# Patient Record
Sex: Male | Born: 1970 | Race: White | Hispanic: No | Marital: Married | Smoking: Never smoker
Health system: Southern US, Community
[De-identification: ages and names within clinical notes are randomized; demographics above are authoritative.]

## PROBLEM LIST (undated history)

## (undated) DIAGNOSIS — G609 Hereditary and idiopathic neuropathy, unspecified: Secondary | ICD-10-CM

## (undated) DIAGNOSIS — N189 Chronic kidney disease, unspecified: Secondary | ICD-10-CM

## (undated) DIAGNOSIS — Z5189 Encounter for other specified aftercare: Secondary | ICD-10-CM

## (undated) DIAGNOSIS — K5190 Ulcerative colitis, unspecified, without complications: Secondary | ICD-10-CM

## (undated) DIAGNOSIS — F329 Major depressive disorder, single episode, unspecified: Secondary | ICD-10-CM

## (undated) DIAGNOSIS — F32A Depression, unspecified: Secondary | ICD-10-CM

## (undated) DIAGNOSIS — F319 Bipolar disorder, unspecified: Secondary | ICD-10-CM

## (undated) DIAGNOSIS — I10 Essential (primary) hypertension: Secondary | ICD-10-CM

## (undated) DIAGNOSIS — F419 Anxiety disorder, unspecified: Secondary | ICD-10-CM

## (undated) HISTORY — DX: Ulcerative colitis, unspecified, without complications: K51.90

## (undated) HISTORY — PX: CYST EXCISION: SHX5701

## (undated) HISTORY — DX: Chronic kidney disease, unspecified: N18.9

## (undated) HISTORY — DX: Encounter for other specified aftercare: Z51.89

## (undated) HISTORY — DX: Depression, unspecified: F32.A

## (undated) HISTORY — DX: Essential (primary) hypertension: I10

## (undated) HISTORY — DX: Bipolar disorder, unspecified: F31.9

## (undated) HISTORY — DX: Anxiety disorder, unspecified: F41.9

## (undated) HISTORY — DX: Major depressive disorder, single episode, unspecified: F32.9

## (undated) HISTORY — DX: Hereditary and idiopathic neuropathy, unspecified: G60.9

---
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
## 2007-10-30 ENCOUNTER — Ambulatory Visit: Payer: Self-pay | Admitting: Internal Medicine

## 2007-10-30 LAB — CONVERTED CEMR LAB
Bilirubin Urine: NEGATIVE
Blood in Urine, dipstick: NEGATIVE
Glucose, Urine, Semiquant: NEGATIVE
Ketones, urine, test strip: NEGATIVE
Nitrite: NEGATIVE
Specific Gravity, Urine: 1.015
Urobilinogen, UA: 0.2
WBC Urine, dipstick: NEGATIVE
pH: 7.5

## 2007-10-31 LAB — CONVERTED CEMR LAB
ALT: 21 units/L (ref 0–53)
AST: 20 units/L (ref 0–37)
Albumin: 4.5 g/dL (ref 3.5–5.2)
Alkaline Phosphatase: 72 units/L (ref 39–117)
BUN: 10 mg/dL (ref 6–23)
Basophils Absolute: 0 10*3/uL (ref 0.0–0.1)
Basophils Relative: 0.5 % (ref 0.0–1.0)
Bilirubin, Direct: 0.2 mg/dL (ref 0.0–0.3)
CO2: 29 meq/L (ref 19–32)
Calcium: 9.6 mg/dL (ref 8.4–10.5)
Chloride: 105 meq/L (ref 96–112)
Cholesterol: 177 mg/dL (ref 0–200)
Creatinine, Ser: 1.1 mg/dL (ref 0.4–1.5)
Eosinophils Absolute: 0.3 10*3/uL (ref 0.0–0.6)
Eosinophils Relative: 5 % (ref 0.0–5.0)
GFR calc Af Amer: 96 mL/min
GFR calc non Af Amer: 80 mL/min
Glucose, Bld: 81 mg/dL (ref 70–99)
HCT: 43 % (ref 39.0–52.0)
HDL: 29.4 mg/dL — ABNORMAL LOW (ref >39.0)
Hemoglobin: 15.1 g/dL (ref 13.0–17.0)
LDL Cholesterol: 120 mg/dL — ABNORMAL HIGH (ref 0–99)
Lymphocytes Relative: 20.3 % (ref 12.0–46.0)
MCHC: 35.1 g/dL (ref 30.0–36.0)
MCV: 88.8 fL (ref 78.0–100.0)
Monocytes Absolute: 0.5 10*3/uL (ref 0.2–0.7)
Monocytes Relative: 6.8 % (ref 3.0–11.0)
Neutro Abs: 4.5 10*3/uL (ref 1.4–7.7)
Neutrophils Relative %: 67.4 % (ref 43.0–77.0)
Platelets: 229 10*3/uL (ref 150–400)
Potassium: 4.3 meq/L (ref 3.5–5.1)
RBC: 4.84 M/uL (ref 4.22–5.81)
RDW: 11.9 % (ref 11.5–14.6)
Sodium: 140 meq/L (ref 135–145)
TSH: 2.42 microintl units/mL (ref 0.35–5.50)
Total Bilirubin: 1.1 mg/dL (ref 0.3–1.2)
Total CHOL/HDL Ratio: 6
Total Protein: 6.5 g/dL (ref 6.0–8.3)
Triglycerides: 136 mg/dL (ref 0–149)
VLDL: 27 mg/dL (ref 0–40)
WBC: 6.7 10*3/uL (ref 4.5–10.5)

## 2007-12-07 ENCOUNTER — Ambulatory Visit: Payer: Self-pay | Admitting: Internal Medicine

## 2007-12-20 ENCOUNTER — Encounter: Payer: Self-pay | Admitting: Internal Medicine

## 2007-12-20 ENCOUNTER — Ambulatory Visit (HOSPITAL_BASED_OUTPATIENT_CLINIC_OR_DEPARTMENT_OTHER): Admission: RE | Admit: 2007-12-20 | Discharge: 2007-12-20 | Payer: Self-pay | Admitting: Internal Medicine

## 2007-12-31 ENCOUNTER — Ambulatory Visit: Payer: Self-pay | Admitting: Internal Medicine

## 2008-01-02 ENCOUNTER — Telehealth: Payer: Self-pay | Admitting: Internal Medicine

## 2008-01-08 ENCOUNTER — Ambulatory Visit: Payer: Self-pay | Admitting: Internal Medicine

## 2008-01-08 DIAGNOSIS — F3289 Other specified depressive episodes: Secondary | ICD-10-CM

## 2008-01-09 LAB — CONVERTED CEMR LAB
ALT: 21 units/L (ref 0–53)
AST: 19 units/L (ref 0–37)
Alkaline Phosphatase: 87 units/L (ref 39–117)
BUN: 10 mg/dL (ref 6–23)
Basophils Relative: 0.6 % (ref 0.0–1.0)
CO2: 30 meq/L (ref 19–32)
Calcium: 9.8 mg/dL (ref 8.4–10.5)
Creatinine, Ser: 1.3 mg/dL (ref 0.4–1.5)
Eosinophils Relative: 4.9 % (ref 0.0–5.0)
Glucose, Bld: 83 mg/dL (ref 70–99)
Hemoglobin: 14.7 g/dL (ref 13.0–17.0)
Monocytes Relative: 6.2 % (ref 3.0–11.0)
Platelets: 189 10*3/uL (ref 150–400)
RDW: 11.9 % (ref 11.5–14.6)
Total Bilirubin: 1.1 mg/dL (ref 0.3–1.2)
Total Protein: 6.4 g/dL (ref 6.0–8.3)
WBC: 5.7 10*3/uL (ref 4.5–10.5)

## 2008-01-11 ENCOUNTER — Ambulatory Visit: Payer: Self-pay | Admitting: Pulmonary Disease

## 2008-01-22 ENCOUNTER — Telehealth: Payer: Self-pay | Admitting: Internal Medicine

## 2008-03-10 ENCOUNTER — Ambulatory Visit: Payer: Self-pay | Admitting: Internal Medicine

## 2008-09-03 ENCOUNTER — Telehealth: Payer: Self-pay | Admitting: Internal Medicine

## 2008-09-10 ENCOUNTER — Ambulatory Visit: Payer: Self-pay | Admitting: Internal Medicine

## 2008-09-10 DIAGNOSIS — I10 Essential (primary) hypertension: Secondary | ICD-10-CM | POA: Insufficient documentation

## 2008-10-07 ENCOUNTER — Ambulatory Visit: Payer: Self-pay | Admitting: Gastroenterology

## 2008-10-08 LAB — CONVERTED CEMR LAB
ALT: 23 units/L (ref 0–53)
Basophils Absolute: 0 10*3/uL (ref 0.0–0.1)
Basophils Relative: 0.4 % (ref 0.0–3.0)
CO2: 29 meq/L (ref 19–32)
Creatinine, Ser: 1 mg/dL (ref 0.4–1.5)
Eosinophils Absolute: 0.3 10*3/uL (ref 0.0–0.7)
GFR calc Af Amer: 107 mL/min
HCT: 41.1 % (ref 39.0–52.0)
Hemoglobin: 14.4 g/dL (ref 13.0–17.0)
IgA: 80 mg/dL (ref 68–378)
Lymphocytes Relative: 20.2 % (ref 12.0–46.0)
MCHC: 35.1 g/dL (ref 30.0–36.0)
MCV: 87.7 fL (ref 78.0–100.0)
Monocytes Absolute: 0.4 10*3/uL (ref 0.1–1.0)
Neutro Abs: 3.7 10*3/uL (ref 1.4–7.7)
RBC: 4.68 M/uL (ref 4.22–5.81)
TSH: 2.82 microintl units/mL (ref 0.35–5.50)
Total Bilirubin: 1.1 mg/dL (ref 0.3–1.2)

## 2008-10-15 ENCOUNTER — Telehealth (INDEPENDENT_AMBULATORY_CARE_PROVIDER_SITE_OTHER): Payer: Self-pay | Admitting: *Deleted

## 2008-10-16 ENCOUNTER — Encounter: Payer: Self-pay | Admitting: Gastroenterology

## 2008-10-23 ENCOUNTER — Ambulatory Visit: Payer: Self-pay | Admitting: Internal Medicine

## 2008-11-18 ENCOUNTER — Encounter: Payer: Self-pay | Admitting: Gastroenterology

## 2008-11-19 ENCOUNTER — Ambulatory Visit: Payer: Self-pay | Admitting: Gastroenterology

## 2008-11-25 ENCOUNTER — Ambulatory Visit: Payer: Self-pay | Admitting: Gastroenterology

## 2008-11-25 ENCOUNTER — Encounter: Payer: Self-pay | Admitting: Gastroenterology

## 2008-11-28 ENCOUNTER — Encounter: Payer: Self-pay | Admitting: Gastroenterology

## 2008-12-24 ENCOUNTER — Telehealth: Payer: Self-pay | Admitting: Internal Medicine

## 2009-03-09 ENCOUNTER — Emergency Department (HOSPITAL_COMMUNITY): Admission: EM | Admit: 2009-03-09 | Discharge: 2009-03-09 | Payer: Self-pay | Admitting: Emergency Medicine

## 2009-03-09 ENCOUNTER — Telehealth: Payer: Self-pay | Admitting: Internal Medicine

## 2009-06-23 ENCOUNTER — Telehealth: Payer: Self-pay | Admitting: Internal Medicine

## 2009-10-06 ENCOUNTER — Encounter: Payer: Self-pay | Admitting: Internal Medicine

## 2010-09-15 ENCOUNTER — Ambulatory Visit: Payer: Self-pay | Admitting: Internal Medicine

## 2010-09-17 LAB — CONVERTED CEMR LAB
Albumin: 4.4 g/dL (ref 3.5–5.2)
Alkaline Phosphatase: 87 units/L (ref 39–117)
BUN: 14 mg/dL (ref 6–23)
Creatinine, Ser: 1.2 mg/dL (ref 0.4–1.5)
GFR calc non Af Amer: 71.36 mL/min (ref >60)
Lithium Lvl: 1.11 meq/L (ref 0.80–1.40)
Potassium: 3.7 meq/L (ref 3.5–5.1)
TSH: 3.54 microintl units/mL (ref 0.35–5.50)

## 2010-10-06 ENCOUNTER — Ambulatory Visit: Payer: Self-pay | Admitting: Internal Medicine

## 2010-10-06 DIAGNOSIS — M79609 Pain in unspecified limb: Secondary | ICD-10-CM

## 2010-10-20 ENCOUNTER — Ambulatory Visit: Payer: Self-pay | Admitting: Internal Medicine

## 2010-11-03 ENCOUNTER — Ambulatory Visit: Payer: Self-pay | Admitting: Sports Medicine

## 2010-11-19 DIAGNOSIS — D235 Other benign neoplasm of skin of trunk: Secondary | ICD-10-CM | POA: Insufficient documentation

## 2010-12-07 ENCOUNTER — Telehealth: Payer: Self-pay | Admitting: Internal Medicine

## 2010-12-07 ENCOUNTER — Encounter: Payer: Self-pay | Admitting: Sports Medicine

## 2010-12-07 ENCOUNTER — Ambulatory Visit
Admission: RE | Admit: 2010-12-07 | Discharge: 2010-12-07 | Payer: Self-pay | Source: Home / Self Care | Attending: Sports Medicine | Admitting: Sports Medicine

## 2010-12-07 DIAGNOSIS — M533 Sacrococcygeal disorders, not elsewhere classified: Secondary | ICD-10-CM | POA: Insufficient documentation

## 2010-12-14 NOTE — Assessment & Plan Note (Signed)
Summary: HTN CONCERNS // RS   Vital Signs:  Patient profile:   40 year old male Weight:      217 pounds BMI:     25.17 Temp:     98.0 degrees F oral Pulse rate:   64 / minute Pulse rhythm:   regular BP sitting:   148 / 94  (left arm) Cuff size:   large  Vitals Entered By: Alfred Levins, CMA (September 15, 2010 12:06 PM) CC: bp check   Primary Care Provider:  Birdie Sons, MD  CC:  bp check.  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 Once Daily 2)  Lamictal 200 Mg Tabs (Lamotrigine) .Marland Kitchen.. 1 By Mouth Once Daily 3)  Lithium Carbonate 300 Mg Tabs (Lithium Carbonate) .... 6 Daily At Bedtime  Allergies (verified): 1)  ! 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 Married Never Smoked Alcohol use-yes--hx of overuse in the past Regular exercise-no drinks 2 caffeinated beverages daily  Risk Factors: Alcohol Use: <1 (10/30/2007) Exercise: no (10/30/2007)  Risk Factors: Smoking Status: never (10/30/2007)  Physical Exam  General:  well-developed well-nourished male in no acute distress. HEENT exam atraumatic, normocephalic. Neck supple without lymphadenopathy. Chest clear to auscultation. Cardiac exam S1-S2 are regular. Abdominal exam active bowel sounds, soft. Extremities there is no  clubbing cyanosis or edema.   Impression & Recommendations:  Problem # 1:  HYPERTENSION (ICD-401.9) discussed medications.he needs to start medication. Side effects of lisinopril discussed. He'll start taking lisinopril 10 mg p.o. q. day. BP today: 148/94 Prior BP: 152/86 (10/23/2008)  Labs Reviewed: K+: 3.7 (10/07/2008) Creat: : 1.0 (10/07/2008)   Chol: 177 (10/30/2007)   HDL: 29.4 (10/30/2007)   LDL: 120 (10/30/2007)   TG: 136 (10/30/2007)  Problem # 2:  ATYPICAL DEPRESSIVE DISORDER (UJW-119.14)  dr fuller requests labs---see orders  Orders: Venipuncture (78295) T-Lithium Level (62130-86578) Specimen Handling (46962) TLB-BMP (Basic Metabolic Panel-BMET) (80048-METABOL) TLB-TSH (Thyroid Stimulating Hormone) (84443-TSH) TLB-Hepatic/Liver Function Pnl (80076-HEPATIC)  Complete Medication List: 1)  Risperidone 2 Mg Tabs (Risperidone) .... Once daily 2)  Lamictal 200 Mg Tabs (Lamotrigine) .Marland Kitchen.. 1 by mouth once daily 3)  Lithium Carbonate 300 Mg Tabs (Lithium carbonate) .... 6 daily at bedtime 4)  Lisinopril 10 Mg Tabs (Lisinopril) .... Take 1 tab by mouth daily  Patient Instructions: 1)  see me 4-6 weeks Prescriptions: LISINOPRIL 10 MG  TABS (LISINOPRIL) Take 1 tab by mouth daily  #90 x 3   Entered and Authorized by:   Birdie Sons MD   Signed by:   Birdie Sons MD on 09/15/2010   Method used:   Print then Give to Patient   RxID:   [id number]    Orders Added: 1)  New Patient Level IV [53664] 2)  Venipuncture [40347] 3)  T-Lithium Level [42595-63875] 4)  Specimen Handling [99000] 5)  TLB-BMP (Basic Metabolic Panel-BMET) [80048-METABOL] 6)  TLB-TSH (Thyroid Stimulating Hormone) [84443-TSH] 7)  TLB-Hepatic/Liver Function Pnl [80076-HEPATIC]

## 2010-12-14 NOTE — Assessment & Plan Note (Signed)
Summary: fu on old sport injury/njr   Vital Signs:  Patient profile:   40 year old male Weight:      217 pounds Temp:     98.2 degrees F oral Pulse rate:   66 / minute Pulse rhythm:   regular BP sitting:   132 / 90  (left arm) Cuff size:   large  Vitals Entered By: Alfred Levins, CMA (October 06, 2010 11:08 AM) CC: referral to PT   Primary Care Daleon Willinger:  Birdie Sons, MD  CC:  referral to PT.  History of Present Illness: htn - better at home 130s/70s  pt has a hx of a sports injury---years ago- twisting injury to right hip 20 years ago.  injury persisted for years---unable to run now sxs have recurred just with walking.   ROS: no rashes, no joint effusions.  Current Medications (verified): 1)  Risperidone 2 Mg Tabs (Risperidone) .... Once Daily 2)  Lamictal 200 Mg Tabs (Lamotrigine) .Marland Kitchen.. 1 By Mouth Once Daily 3)  Lithium Carbonate 300 Mg Tabs (Lithium Carbonate) .... 6 Daily At Bedtime 4)  Lisinopril 10 Mg  Tabs (Lisinopril) .... Take 1 Tab By Mouth Daily  Allergies (verified): 1)  ! Penicillin V Potassium (Penicillin V Potassium)  Physical Exam  General:  Tolle, well-developed male in no acute distress. HEENT exam atraumatic, normocephalic, neck supple. Full range of motion of both hips.   Impression & Recommendations:  Problem # 1:  LEG PAIN, RIGHT (ICD-729.5) unclear etiology. I think he needs further evaluation. I don't think chiropractic care will help him. Refer to sports medicine. Orders: Sports Medicine (Sports Med)  Problem # 2:  HYPERTENSION (ICD-401.9)  His updated medication list for this problem includes:    Lisinopril 10 Mg Tabs (Lisinopril) .Marland Kitchen... Take 1 tab by mouth daily  BP today: 132/90 Prior BP: 148/94 (09/15/2010)  Labs Reviewed: K+: 3.7 (09/15/2010) Creat: : 1.2 (09/15/2010)   Chol: 177 (10/30/2007)   HDL: 29.4 (10/30/2007)   LDL: 120 (10/30/2007)   TG: 136 (10/30/2007)  Complete Medication List: 1)  Risperidone 2 Mg Tabs  (Risperidone) .... Once daily 2)  Lamictal 200 Mg Tabs (Lamotrigine) .Marland Kitchen.. 1 by mouth once daily 3)  Lithium Carbonate 300 Mg Tabs (Lithium carbonate) .... 6 daily at bedtime 4)  Lisinopril 10 Mg Tabs (Lisinopril) .... Take 1 tab by mouth daily   Orders Added: 1)  Est. Patient Level III [04540] 2)  Sports Medicine [Sports Med]
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
## 2010-12-16 NOTE — Letter (Signed)
Summary: *Consult Note  [facility]  [address]   [place], [place] [address]   Phone: [phone]  Fax: [phone]    Re:    Sean Ray DOB:    10-19-1971 Birdie Sons, MD [facility] 12/07/10   Dear Smitty Cords:    Thank you for requesting that we see the above patient for consultation.  A copy of the detailed office note will be sent under separate cover, for your review.  Evaluation today is consistent with:  1)  SACROILIAC JOINT DYSFUNCTION (ICD-724.6) 2) Leg pain on Right   Our recommendation is for: series of exercises and stretches to see if we can restore his normal biomechanics.  I think he began compensating for his original injury and has not been able to restore normal pelvic rotation necessary for effective running.   New Orders include:  1)  Consultation Level II [99242]   After today's visit, the patients current medications include: 1)  RISPERIDONE 2 MG TABS (RISPERIDONE) once daily 2)  LAMICTAL 200 MG TABS (LAMOTRIGINE) 1 by mouth once daily 3)  LITHIUM CARBONATE 300 MG TABS (LITHIUM CARBONATE) 6 daily at bedtime 4)  LISINOPRIL 10 MG  TABS (LISINOPRIL) Take 1 tab by mouth daily   Thank you for this consultation.  If you have any further questions regarding the care of this patient, please do not hesitate to contact me @ [phone].  Thank you for this opportunity to look after your patient.  Sincerely,  Vincent Gros MD

## 2010-12-16 NOTE — Assessment & Plan Note (Signed)
Summary: 5 wk rov/njr---PT [facility] // RS ,pt rsc per doc/njr   Vital Signs:  Patient profile:   40 year old male Weight:      216 pounds Temp:     98.4 degrees F oral Pulse rate:   72 / minute Pulse rhythm:   regular BP sitting:   122 / 80  (left arm) Cuff size:   large  Vitals Entered By: Azucena Freed,  MA Student  CC: 5 wk f/u---bp concerns, mole concerns, 2nd toe on L fungal infection?   Primary Care Provider:  Birdie Sons, MD  CC:  5 wk f/u---bp concerns, mole concerns, and 2nd toe on L fungal infection?.  History of Present Illness:  Follow-Up Visit      This is a 40 year old man who presents for Follow-up visit.  The patient denies chest pain and palpitations.  Since the last visit the patient notes no new problems or concerns.  The patient reports taking meds as prescribed and monitoring BP.  When questioned about possible medication side effects, the patient notes none.   home Bps 110s-140/70s-88  tolerating meds  new concerns---has a   All other systems reviewed and were negative   Allergies: 1)  ! 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 HEENT exam atraumatic,  normocephalic, neck supple. Chest clear to auscultation cardiac exam S1-S2 are regular. Extremities without edema.   Impression & Recommendations:  Problem # 1:  HYPERTENSION (ICD-401.9) Assessment Improved controlled continue current medications  His updated medication list for this problem includes:    Lisinopril 10 Mg Tabs (Lisinopril) .Marland Kitchen... 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 Once daily 2)  Lamictal 200 Mg Tabs (Lamotrigine) .Marland Kitchen.. 1 by mouth once daily 3)  Lithium Carbonate 300 Mg Tabs (Lithium carbonate) .... 6 daily at bedtime 4)  Lisinopril 10 Mg Tabs (Lisinopril) .... Take 1 tab by mouth daily   Orders Added: 1)  Est. Patient Level III [[address]]

## 2010-12-16 NOTE — Progress Notes (Signed)
  Phone Note Call from Patient   Caller: Patient Call For: Birdie Sons MD Summary of Call: Pt's pyschiatrist would like to have Dr. Cato Mulligan make a referral to a Urologist for his long term medication use. [phone] Initial call taken by: Lynann Beaver CMA AAMA,  December 07, 2010 3:35 PM  Follow-up for Phone Call        urologist? why? what long term medication? Follow-up by: Birdie Sons MD,  December 08, 2010 6:59 AM  Additional Follow-up for Phone Call Additional follow up Details #1::        He says Lithium and his other Bipolar meds are  what the pysch is telling him that interfere with kidney function? Additional Follow-up by: Lynann Beaver CMA AAMA,  December 08, 2010 8:17 AM    Additional Follow-up for Phone Call Additional follow up Details #2::     scheduled basic metabolic panel every 6 months. last one was in November.  renal function has been normal every time it has been checked. Follow-up by: Birdie Sons MD,  December 09, 2010 5:28 AM  Additional Follow-up for Phone Call Additional follow up Details #3:: Details for Additional Follow-up Action Taken: He will have his Pysch call us with exactly what they want. Additional Follow-up by: Lynann Beaver CMA AAMA,  December 09, 2010 8:25 AM

## 2011-02-01 ENCOUNTER — Ambulatory Visit (INDEPENDENT_AMBULATORY_CARE_PROVIDER_SITE_OTHER): Payer: BC Managed Care – PPO | Admitting: Sports Medicine

## 2011-02-01 ENCOUNTER — Encounter: Payer: Self-pay | Admitting: Sports Medicine

## 2011-02-01 VITALS — BP 104/70 | Ht 78.0 in | Wt 220.0 lb

## 2011-02-01 DIAGNOSIS — M542 Cervicalgia: Secondary | ICD-10-CM

## 2011-02-01 DIAGNOSIS — M533 Sacrococcygeal disorders, not elsewhere classified: Secondary | ICD-10-CM

## 2011-02-01 DIAGNOSIS — F319 Bipolar disorder, unspecified: Secondary | ICD-10-CM

## 2011-02-01 NOTE — Progress Notes (Signed)
Subjective:    Patient ID: Sean Ray is a 40 y.o. male.  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 with running in lower back       - pefromed exercises  For 8 weeks approx 3-4x a week      -- Denies any back pain or hip pain for the past few weeks            Still feels weaker on the right side at times but feels he is getting stronger  Has not noticed any change with the temp orthotics  Regarding his gait but they feel comoftable  Neck Pain- woke up a few days ago with tingling and numbness and slight neck pain, felt like he slept on his neck wrong      ROS       Objective:   Ortho Exam    GEN- NAD, alert and oriented   Neck- normal ROM, no cervical spine  tenderness    MSK- Hip- FROM, IR/ER > 75 degrees,                      Strength- Abductors 5/5 bilat, flexors- 5/5 bilat             Faber- tight o nleft side             SI mobility- more mobile onright compared to left             UE- FROM  Neuro- sensation grossly in tact   Gait- no pronation, foot hits midline with run, SI joint dysfunction not noticed  Assessment:        SI joint dysfunction- improvement in movement of SI with initial exercise program, his running technique is corrected with strengthening and cushion for mild pronation  Neck pain- good mobility of neck, likely stretch injury secondary to pt hypermobility state  Bipolar disorder I think an exercise program with focus on 30 mins aerobic daily will be beneficial i limiting depression sxs    Plan:  . Will plan to start walk/run program, continue maintenance exercises for Hip mobility.       Neck- watch sleeping position, given exercises to stretch trapezius    Note- also discussed that a good exercise program may also contribute to improving his depressive mood/Bipolar disorder Milinda Antis MD, PGY-3

## 2011-02-01 NOTE — Progress Notes (Signed)
Pt here today to f/u with his SI joint and L leg pain which he says is about the same as last visit which he attributes to not having run.

## 2011-02-01 NOTE — Patient Instructions (Signed)
Start your walking/Run program- start with 20 minutes and increase by 5 minutes Uses stretches in between for any discomofort Continue your rehab exercises as previous( adductors/abductors and hip rotation, start step exercises) Return in 3 months

## 2011-02-23 LAB — COMPREHENSIVE METABOLIC PANEL
ALT: 21 U/L (ref 0–53)
AST: 22 U/L (ref 0–37)
Calcium: 9.5 mg/dL (ref 8.4–10.5)
GFR calc Af Amer: >60 mL/min (ref >60)
Sodium: 142 mEq/L (ref 135–145)
Total Protein: 6.5 g/dL (ref 6.0–8.3)

## 2011-02-23 LAB — ETHANOL: Alcohol, Ethyl (B): <5 mg/dL (ref 0–10)

## 2011-03-29 NOTE — Assessment & Plan Note (Signed)
[facility]                                 ON-CALL NOTE   Sean Ray, Sean Ray                       MRN:          [id number]  DATE:12/30/2007                            DOB:          03-06-1971    PHONE NUMBER:  [phone]   Patient Dr. [name]   The patient fell today playing tennis.  Hit his head.  Had no loss of  consciousness et Karie Soda.  Since he is otherwise well, advised Tylenol,  ice, and call to see Dr. [name] tomorrow for eval.     Eugenio Hoes. Tawanna Cooler, MD  Electronically Signed    JAT/MedQ  DD: 12/30/2007  DT: 12/31/2007  Job #: [id number]

## 2011-03-29 NOTE — Procedures (Signed)
Sean Ray, Sean Ray              ACCOUNT NO.:  [id number]   MEDICAL RECORD NO.:  [id number]          PATIENT TYPE:  OUT   LOCATION:  SLEEP CENTER                 FACILITY:  [facility]   PHYSICIAN:  Barbaraann Share, MD,FCCPDATE OF BIRTH:  01/10/69   DATE OF STUDY:  12/20/2007                            NOCTURNAL POLYSOMNOGRAM   REFERRING PHYSICIAN:  Bruce H. Swords, MD   INDICATION FOR STUDY:  Hypersomnia with sleep apnea.   EPWORTH SLEEPINESS SCORE:  Is 4.   MEDICATIONS:   SLEEP ARCHITECTURE:  The patient had a total sleep time of 369 minutes  with no slow wave sleep and decreased REM.  Sleep onset latency was  normal at 25 minutes and REM onset was prolonged at 127 minutes.  Sleep  efficiency was decreased at 88%.   RESPIRATORY DATA:  The patient was found to have 31 obstructive apnea,  four central apneas and 12 obstructive hypopneas, for an apnea/hypopnea  index of 8 events per hour.  He was also noted to have 17 respiratory  effort-related arousals for a respiratory disturbance index of 10 events  per hour.  Loud snoring was noted throughout.  It should also be noted  that all of the patient's events occurred in the supine position.   OXYGEN DATA:  There was an O2 desaturation as low as 92% with the  patient's obstructive events.   CARDIAC DATA:  No clinically-significant arrhythmias were noted.   MOVEMENT/PARASOMNIA:  None.   IMPRESSIONS/RECOMMENDATIONS:  1. Mild obstructive sleep apnea with an apnea/hypopnea index of eight      events per hour and a respiratory disturbance index of 10 events      per hour when respiratory effort-related arousals are included.      All of the patient's events occurred in the supine position and      there was O2 desaturation as low as 92%.  Treatment for this degree      of sleep apnea can include weight loss alone if applicable,      positional therapy since all of his events occurred in the supine      position, oral appliance, upper  airway surgery and finally CPAP.      Clinical correlation is      suggested.  2. No clinically-significant arrhythmias or abnormal behaviors were      noted.      Barbaraann Share, MD,FCCP  Diplomate, American Board of Sleep  Medicine  Electronically Signed     [facility]/MEDQ  D:  01/09/2008 06:36:29  T:  01/09/2008 14:33:27  Job:  [id number]

## 2011-05-10 ENCOUNTER — Ambulatory Visit (INDEPENDENT_AMBULATORY_CARE_PROVIDER_SITE_OTHER): Payer: BC Managed Care – PPO | Admitting: Family Medicine

## 2011-05-10 ENCOUNTER — Encounter: Payer: Self-pay | Admitting: Family Medicine

## 2011-05-10 VITALS — BP 131/78

## 2011-05-10 DIAGNOSIS — G5700 Lesion of sciatic nerve, unspecified lower limb: Secondary | ICD-10-CM | POA: Insufficient documentation

## 2011-05-10 NOTE — Patient Instructions (Signed)
PIRIFORMIS SYNDROME REHAB 1. Work on pretzel stretching, shoulder back and leg draped in front. 3-5 sets, 30 sec.. 2. hip abductor rotations. standing, hip flexion and rotation outward then inward. 3 sets, 15 reps. when can do comfortably, add ankle weights starting at 2 pounds.  3. cross over stretching - shoulder back to ground, same side leg crossover. 3-5 sets for 30 min..  4. SINK STRETCH - YOU CAN DO THIS WHENEVER YOU WANT DURING THE DAY  Tennis ball underneath area in buttocks - on a hard surface underneath Can also massage this area with an electronic massager or hand  F/u if not improving in 4-6 weeks

## 2011-05-10 NOTE — Progress Notes (Signed)
  Subjective:    Patient ID: Sean Ray, male    DOB: June 08, 1971, 40 y.o.   MRN: [id number]  HPI 40 yo M with history of lumbar back pain who presents with 10 day history of pain in Right gluteus and hamstring:  1.  Right Buttock pain:  Recently returned from 12 hour plane flight to [place], returned about 2 weeks ago, where he also participated in several vigorous hikes up and down hills, usually not used to much physical exertion.  After he returned he began having pain in Right buttock area with some radiation down hamstring, but mostly in buttock.  Also c/o some paresthesias in calf and hamstring along with numbness on sole of foot.  Pain described as sharp, shooting pain, rates 8/10 at worst, exacerbated by standing and movement in bed.  Has not been taking anything for relief, no ice or heat.  Denies any fevers, weight loss, bowel/urinary incontinence, weakness, headaches.      Review of Systems REVIEW OF SYSTEMS  GEN: No fevers, chills. Nontoxic. Primarily MSK c/o today. MSK: Detailed in the HPI GI: tolerating PO intake without difficulty Neuro: detailed above Otherwise the pertinent positives of the ROS are noted above.       Objective:   Physical Exam Gen:  Alert, cooperative patient who appears stated age in no acute distress.  Vital signs reviewed. MSK:  Tender to palpation Right piriformis area.  Reverse Faber positive for pain.  Straight leg raise reproduces pain - buttocks only, does not cause any numbness nor paresthesias.  Good ROM BL hips and knees.  Strength 5/5 BL LE's.   CV:  Distal pulses +2 bilaterally Neuro:  Sensation grossly intact and equal bilaterally lower extremities.  No numbness noted on exam.  No gait abnormality.   Heel walk and toe walk WNL Neurovascularly fully intact       Assessment & Plan:

## 2011-05-10 NOTE — Assessment & Plan Note (Addendum)
Using an anatomical chart, reviewed with the patient the structures involved and how they related to diagnosis. The patient indicated understanding.   Also given a handout with more extensive Piriformis stretching, hip flexor and abductor strengthening, ham stretching  Rec deep massage, explained self-massage with ball

## 2011-05-20 ENCOUNTER — Ambulatory Visit: Payer: Self-pay | Admitting: Internal Medicine

## 2011-06-01 ENCOUNTER — Ambulatory Visit: Payer: BC Managed Care – PPO | Admitting: Sports Medicine

## 2011-06-22 ENCOUNTER — Ambulatory Visit (INDEPENDENT_AMBULATORY_CARE_PROVIDER_SITE_OTHER): Payer: BC Managed Care – PPO | Admitting: Internal Medicine

## 2011-06-22 ENCOUNTER — Encounter: Payer: Self-pay | Admitting: Internal Medicine

## 2011-06-22 VITALS — BP 138/88 | HR 76 | Temp 97.9°F | Ht 78.0 in | Wt 212.0 lb

## 2011-06-22 DIAGNOSIS — I10 Essential (primary) hypertension: Secondary | ICD-10-CM

## 2011-06-22 DIAGNOSIS — F3289 Other specified depressive episodes: Secondary | ICD-10-CM

## 2011-06-22 NOTE — Progress Notes (Signed)
  Subjective:    Patient ID: Sean Ray, male    DOB: 1971/08/12, 40 y.o.   MRN: [id number]  HPI  Psych---he is seeing psychiatrist---thinking about stopping lithium  htn---tolerating meds  Past Medical History  Diagnosis Date  . Bipolar disorder    No past surgical history on file.  reports that he has never smoked. He does not have any smokeless tobacco history on file. He reports that he drinks alcohol. His drug history not on file. family history includes Cancer in his mother and Hypertension in his mother. Allergies  Allergen Reactions  . Penicillins     REACTION: as child, swelling     Review of Systems  patient denies chest pain, shortness of breath, orthopnea. Denies lower extremity edema, abdominal pain, change in appetite, change in bowel movements. Patient denies rashes, musculoskeletal complaints. No other specific complaints in a complete review of systems.      Objective:   Physical Exam  well-developed well-nourished male in no acute distress. HEENT exam atraumatic, normocephalic, neck supple without jugular venous distention. Chest clear to auscultation cardiac exam S1-S2 are regular. Abdominal exam overweight with bowel sounds, soft and nontender. Extremities no edema. Neurologic exam is alert with a normal gait.        Assessment & Plan:

## 2011-06-22 NOTE — Assessment & Plan Note (Signed)
Seeing psychiatry He has an intermittent tremor Considering changing to a different medication

## 2011-06-30 NOTE — Assessment & Plan Note (Signed)
BP Readings from Last 3 Encounters:  06/22/11 138/88  05/10/11 131/78  02/01/11 104/70   Fair control. Continue current medications.

## 2011-08-22 ENCOUNTER — Other Ambulatory Visit: Payer: Self-pay | Admitting: *Deleted

## 2011-08-22 MED ORDER — LISINOPRIL 10 MG PO TABS: 10.0000 mg | ORAL_TABLET | Freq: Every day | ORAL | Status: AC

## 2011-11-16 ENCOUNTER — Ambulatory Visit (INDEPENDENT_AMBULATORY_CARE_PROVIDER_SITE_OTHER): Payer: BC Managed Care – PPO | Admitting: Family

## 2011-11-16 ENCOUNTER — Encounter: Payer: Self-pay | Admitting: Family

## 2011-11-16 VITALS — BP 140/90 | HR 69 | Temp 98.3°F | Wt 197.0 lb

## 2011-11-16 DIAGNOSIS — H6690 Otitis media, unspecified, unspecified ear: Secondary | ICD-10-CM

## 2011-11-16 MED ORDER — AZITHROMYCIN 250 MG PO TABS: ORAL_TABLET | ORAL | Status: AC

## 2011-11-16 NOTE — Patient Instructions (Signed)

## 2011-11-16 NOTE — Progress Notes (Signed)
  Subjective:    Patient ID: Sean Ray, male    DOB: October 06, 1971, 41 y.o.   MRN: [id number]  HPI 41 year old white male, nonsmoker, patient of Dr. Hermelinda Medicus is in today with complaints of left ear pain that's been going on for one week he feels a sense of fullness in both years. Reports a cough and some cough for several weeks that's productive of yellow sputum. He has had 2 doses of Sudafed did not help. He denies any lightheadedness, dizziness, sinus pressure, chest pain, shortness of breath, nausea or vomiting.   Review of Systems  HENT: Positive for ear pain.   Respiratory: Positive for cough.   Cardiovascular: Negative.   Gastrointestinal: Negative.   Genitourinary: Negative.   Musculoskeletal: Negative.   Skin: Negative.   Neurological: Negative.   Psychiatric/Behavioral: Negative.    Past Medical History  Diagnosis Date  . Bipolar disorder     History   Social History  . Marital Status: Married    Spouse Name: N/A    Number of Children: N/A  . Years of Education: N/A   Occupational History  . Not on file.   Social History Main Topics  . Smoking status: Never Smoker   . Smokeless tobacco: Not on file  . Alcohol Use: Yes     hx of alcohol abuse  . Drug Use: Not on file  . Sexually Active: Not on file   Other Topics Concern  . 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  Family History  Problem Relation Age of Onset  . Cancer Mother     breast  . Hypertension Mother     Allergies  Allergen Reactions  . Penicillins     REACTION: as child, swelling    Current Outpatient Prescriptions on File Prior to Visit  Medication Sig Dispense Refill  . lamoTRIgine (LAMICTAL) 200 MG tablet Take 200 mg by mouth daily.        Marland Kitchen lisinopril (PRINIVIL,ZESTRIL) 10 MG tablet Take 1 tablet (10 mg total) by mouth daily.  90 tablet  1  . lithium 300 MG  capsule Take 300 mg by mouth. 5 tabs QHS      . risperiDONE (RISPERDAL) 1 MG tablet Take 1 mg by mouth daily.          BP 140/90  Pulse 69  Temp(Src) 98.3 F (36.8 C) (Oral)  Wt 197 lb (89.359 kg)chart    Objective:   Physical Exam  Constitutional: He is oriented to person, place, and time. He appears well-developed and well-nourished.  HENT:  Right Ear: External ear normal.  Nose: Nose normal.  Mouth/Throat: Oropharynx is clear and moist.       Left tympanic membrane red and bulging. Right ear normal  Eyes: Conjunctivae are normal.  Neck: Normal range of motion. Neck supple.  Cardiovascular: Normal rate, regular rhythm and normal heart sounds.   Pulmonary/Chest: Effort normal and breath sounds normal.  Musculoskeletal: Normal range of motion.  Neurological: He is alert and oriented to person, place, and time.  Skin: Skin is warm and dry.  Psychiatric: He has a normal mood and affect.          Assessment & Plan:  Assessment: Left otitis media  Plan continue Sudafed as directed. Z-Pak as directed. Patient to call the office if symptoms worsen or persist. Recheck scheduled appearing.

## 2011-11-22 DIAGNOSIS — F319 Bipolar disorder, unspecified: Secondary | ICD-10-CM | POA: Insufficient documentation

## 2011-12-23 ENCOUNTER — Ambulatory Visit (INDEPENDENT_AMBULATORY_CARE_PROVIDER_SITE_OTHER): Payer: BC Managed Care – PPO | Admitting: Internal Medicine

## 2011-12-23 ENCOUNTER — Ambulatory Visit: Payer: BC Managed Care – PPO | Admitting: Internal Medicine

## 2011-12-23 ENCOUNTER — Encounter: Payer: Self-pay | Admitting: Internal Medicine

## 2011-12-23 DIAGNOSIS — I10 Essential (primary) hypertension: Secondary | ICD-10-CM

## 2011-12-23 NOTE — Progress Notes (Signed)
Patient ID: Sean Ray, male   DOB: 12/04/1970, 41 y.o.   MRN: [id number] Left ear---has had issues with "gurgling" after a plane ride---now improving.   HTN---tolerating meds without difficulty  Atypical depression---sees psych regularly.  Past Medical History  Diagnosis Date  . Bipolar disorder     History   Social History  . Marital Status: Married    Spouse Name: N/A    Number of Children: N/A  . Years of Education: N/A   Occupational History  . Not on file.   Social History Main Topics  . Smoking status: Never Smoker   . Smokeless tobacco: Not on file  . Alcohol Use: Yes     hx of alcohol abuse  . Drug Use: Not on file  . Sexually Active: Not on file   Other Topics Concern  . 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  Family History  Problem Relation Age of Onset  . Cancer Mother     breast  . Hypertension Mother     Allergies  Allergen Reactions  . Penicillins     REACTION: as child, swelling    Current Outpatient Prescriptions on File Prior to Visit  Medication Sig Dispense Refill  . lamoTRIgine (LAMICTAL) 200 MG tablet Take 200 mg by mouth daily.        Marland Kitchen lisinopril (PRINIVIL,ZESTRIL) 10 MG tablet Take 1 tablet (10 mg total) by mouth daily.  90 tablet  1  . lithium 300 MG capsule Take 300 mg by mouth. 5 tabs QHS         patient denies chest pain, shortness of breath, orthopnea. Denies lower extremity edema, abdominal pain, change in appetite, change in bowel movements. Patient denies rashes, musculoskeletal complaints. No other specific complaints in a complete review of systems.   BP 122/84  Pulse 64  Temp(Src) 98.1 F (36.7 C) (Oral)  Wt 195 lb (88.451 kg)  well-developed well-nourished male in no acute distress. HEENT exam atraumatic, normocephalic, neck supple without jugular venous distention. Chest clear to  auscultation cardiac exam S1-S2 are regular. Abdominal exam overweight with bowel sounds, soft and nontender. Extremities no edema. Neurologic exam is alert with a normal gait.   Ear pain: resolving

## 2011-12-23 NOTE — Assessment & Plan Note (Signed)
Controlled Continue current meds Psychiatry has checked recent

## 2012-01-18 DIAGNOSIS — R0789 Other chest pain: Secondary | ICD-10-CM | POA: Insufficient documentation

## 2012-01-18 DIAGNOSIS — F419 Anxiety disorder, unspecified: Secondary | ICD-10-CM | POA: Insufficient documentation

## 2012-10-08 ENCOUNTER — Other Ambulatory Visit (HOSPITAL_COMMUNITY): Payer: Self-pay | Admitting: Psychiatry

## 2012-10-21 ENCOUNTER — Other Ambulatory Visit (HOSPITAL_COMMUNITY): Payer: Self-pay | Admitting: Psychiatry

## 2013-11-01 DIAGNOSIS — R200 Anesthesia of skin: Secondary | ICD-10-CM | POA: Insufficient documentation

## 2014-11-16 ENCOUNTER — Other Ambulatory Visit (HOSPITAL_COMMUNITY): Payer: Self-pay | Admitting: Psychiatry

## 2016-11-28 ENCOUNTER — Encounter: Payer: Self-pay | Admitting: Neurology

## 2016-11-28 ENCOUNTER — Ambulatory Visit (INDEPENDENT_AMBULATORY_CARE_PROVIDER_SITE_OTHER): Payer: BC Managed Care – PPO | Admitting: Neurology

## 2016-11-28 DIAGNOSIS — G609 Hereditary and idiopathic neuropathy, unspecified: Secondary | ICD-10-CM | POA: Diagnosis not present

## 2016-11-28 HISTORY — DX: Hereditary and idiopathic neuropathy, unspecified: G60.9

## 2016-11-28 NOTE — Progress Notes (Signed)
Reason for visit: Peripheral neuropathy  Referring physician: Dr. Karis Juba Artino is a 46 y.o. male  History of present illness:  Mr. Lesniewski is a 46 year old right-handed white male with a history of what he was told was a peripheral neuropathy. The patient indicated that he began having symptoms of numbness in the feet that began 7-8 years ago, and gradually worsened. In 2015, he underwent evaluation at [facility], and nerve conduction studies were done by Dr. Tillman Abide on 01/29/2014. The patient was told that the nerve conduction studies were slightly abnormal, and diagnosed him with a peripheral neuropathy, but I am unable to visualize the actual NCV report on EPIC. The patient indicates that he never had significant discomfort in the feet, only numbness. At one point, he noted numbness almost up to the knee level bilaterally. Over the last 2 years, the numbness has actually improved over time. The patient apparently has gone off of lithium over the last 2 years. The patient had renal impairment associated with the chronic use of lithium. The patient denies any balance issues, he has not had any falls. He denies any numbness in the hands. He does not report pain in the neck or low back or pain down arms or legs. He denies any issues controlling the bowels or the bladder. He has bipolar disorder and anxiety issues. His father also had a peripheral neuropathy after the age of 32. He comes in to this office for an evaluation.  Past Medical History:  Diagnosis Date  . Anxiety   . Bipolar disorder ([place])   . Depression   . 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 Hypertension     Past Surgical History:  Procedure Laterality Date  . TONSILLECTOMY AND ADENOIDECTOMY  1984    Family History  Problem Relation Age of Onset  . Cancer Mother     breast  . Hypertension Mother   . Arthritis Mother   . Parkinson's disease Father   . Neuropathy Father     Social  history:  reports that he has never smoked. He has never used smokeless tobacco. He reports that he drinks alcohol. He reports that he does not use drugs.  Medications:  Prior to Admission medications   Medication Sig Start Date End Date Taking? Authorizing Provider  b complex vitamins capsule Take 1 capsule by mouth daily.   Yes Historical Provider, MD  Cholecalciferol (VITAMIN D3) 1000 units CAPS Take 1-2 capsules by mouth daily.   Yes Historical Provider, MD  lamoTRIgine (LAMICTAL) 200 MG tablet Take 200 mg by mouth daily.     Yes Historical Provider, MD  lisinopril (PRINIVIL,ZESTRIL) 10 MG tablet Take 1 tablet (10 mg total) by mouth daily. 08/22/11  Yes Lisabeth Pick, MD  Multiple Vitamin (MULTIVITAMIN) tablet Take 1 tablet by mouth daily.   Yes Historical Provider, MD  Omega-3 Fatty Acids (FISH OIL PO) Take by mouth daily.   Yes Historical Provider, MD  risperiDONE (RISPERDAL) 1 MG tablet Take by mouth.   Yes Historical Provider, MD      Allergies  Allergen Reactions  . Penicillins     REACTION: as child, swelling    ROS:  Out of a complete 14 system review of symptoms, the patient complains only of the following symptoms, and all other reviewed systems are negative.  Diarrhea Anxiety  Height 6\' 6"  (1.981 m), weight 198 lb (89.8 kg).  Physical Exam  General: The patient is alert and cooperative at the time  of the examination.  Eyes: Pupils are equal, round, and reactive to light. Discs are flat bilaterally.  Neck: The neck is supple, no carotid bruits are noted.  Respiratory: The respiratory examination is clear.  Cardiovascular: The cardiovascular examination reveals a regular rate and rhythm, no obvious murmurs or rubs are noted.  Skin: Extremities are without significant edema.  Neurologic Exam  Mental status: The patient is alert and oriented x 3 at the time of the examination. The patient has apparent normal recent and remote memory, with an apparently normal  attention span and concentration ability.  Cranial nerves: Facial symmetry is present. There is good sensation of the face to pinprick and soft touch bilaterally. The strength of the facial muscles and the muscles to head turning and shoulder shrug are normal bilaterally. Speech is well enunciated, no aphasia or dysarthria is noted. Extraocular movements are full. Visual fields are full. The tongue is midline, and the patient has symmetric elevation of the soft palate. No obvious hearing deficits are noted.  Motor: The motor testing reveals 5 over 5 strength of all 4 extremities. Good symmetric motor tone is noted throughout.  Sensory: Sensory testing is intact to pinprick, soft touch, vibration sensation, and position sense on all 4 extremities. No evidence of extinction is noted.  Coordination: Cerebellar testing reveals good finger-nose-finger and heel-to-shin bilaterally.  Gait and station: Gait is normal. Tandem gait is normal. Romberg is negative. No drift is seen.  Reflexes: Deep tendon reflexes are symmetric and normal bilaterally, the patient does have ankle jerk reflexes bilaterally that are slightly decreased. Toes are downgoing bilaterally.   Assessment/Plan:  1. History of peripheral neuropathy  The patient gives a 7-8 year history of numbness in the feet, he was told that he had a peripheral neuropathy after a nerve conduction study done in March 2015 at [facility], we will try to get the actual report of the study. The patient be sent for nerve conduction studies of both legs, one arm, EMG of one leg. I will follow-up with him on that evaluation. 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 There are anecdotal reports of peripheral neuropathies being associated with lithium use, the improvement in symptoms seem to correlate with cessation of lithium dosing. The patient had blood work evaluation at the time of the diagnosis of peripheral  neuropathy, the serum immunoelectrophoresis was unremarkable, chemistry panel and CBC were unremarkable. The patient is on B complex vitamins currently.  Jill Alexanders MD 11/28/2016 12:14 PM  [facility] [address] [place], [place] [address]  Phone [phone] Fax [phone]

## 2016-11-29 ENCOUNTER — Telehealth: Payer: Self-pay

## 2016-11-29 NOTE — Telephone Encounter (Signed)
Request for medical records faxed to [facility] @ [phone].

## 2016-11-29 NOTE — Telephone Encounter (Signed)
-----   Message from Kathrynn Ducking, MD sent at 11/28/2016  5:36 PM EST ----- Please contact [facility], neurology department, please have the nerve conduction study done by Dr. Tillman Abide in March 2015 faxed to our office. Thank you.

## 2016-12-02 ENCOUNTER — Telehealth: Payer: Self-pay | Admitting: Neurology

## 2016-12-02 NOTE — Telephone Encounter (Signed)
I have gotten the EMG and nerve conduction study results from [facility].  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 The study was done on the right arm and right leg. The distal motor latencies for the right peroneal and right posterior tibial nerves were normal with normal motor amplitudes, the nerve conduction velocities for these nerves were minimally slow at 40.  The right median nerve was normal, the distal motor latency for the right ulnar nerve was normal, but there was some slowing across the elbow, 43 above and 51 below consistent with a tardy ulnar palsy. The F wave latency for the peroneal nerve was 65, and for the posterior tibial nerve was 75. The patient is tall, however.  The F wave latency for the median nerve was 32.9, and 33.5 for the ulnar nerve. The right sural sensory latency was normal, the sensory latency for the right median and ulnar nerves were normal. EMG of the right lower extremity was unremarkable.

## 2016-12-08 DIAGNOSIS — M25551 Pain in right hip: Secondary | ICD-10-CM | POA: Insufficient documentation

## 2016-12-08 DIAGNOSIS — N189 Chronic kidney disease, unspecified: Secondary | ICD-10-CM | POA: Insufficient documentation

## 2016-12-08 DIAGNOSIS — N486 Induration penis plastica: Secondary | ICD-10-CM | POA: Insufficient documentation

## 2016-12-08 DIAGNOSIS — R197 Diarrhea, unspecified: Secondary | ICD-10-CM | POA: Insufficient documentation

## 2017-01-16 ENCOUNTER — Telehealth: Payer: Self-pay | Admitting: Neurology

## 2017-01-16 ENCOUNTER — Encounter: Payer: BC Managed Care – PPO | Admitting: Neurology

## 2017-01-16 NOTE — Telephone Encounter (Signed)
This patient did not show for an EMG today

## 2017-01-17 ENCOUNTER — Encounter: Payer: Self-pay | Admitting: Neurology

## 2019-06-17 ENCOUNTER — Encounter: Payer: Self-pay | Admitting: Gastroenterology

## 2019-07-26 ENCOUNTER — Telehealth: Payer: Self-pay | Admitting: *Deleted

## 2019-07-26 NOTE — Telephone Encounter (Signed)
Patient no-show for pre-visit. Message left to call before end of day to avoid cancellation of up-coming colonoscopy.

## 2019-07-30 ENCOUNTER — Encounter: Payer: Self-pay | Admitting: Gastroenterology

## 2019-08-01 ENCOUNTER — Other Ambulatory Visit: Payer: Self-pay

## 2019-08-01 ENCOUNTER — Encounter: Payer: Self-pay | Admitting: Gastroenterology

## 2019-08-01 ENCOUNTER — Ambulatory Visit (AMBULATORY_SURGERY_CENTER): Payer: Self-pay | Admitting: *Deleted

## 2019-08-01 VITALS — Temp 96.4°F | Ht 78.0 in | Wt 206.0 lb

## 2019-08-01 DIAGNOSIS — Z1211 Encounter for screening for malignant neoplasm of colon: Secondary | ICD-10-CM

## 2019-08-01 MED ORDER — PEG 3350-KCL-NA BICARB-NACL 420 G PO SOLR: 4000.0000 mL | Freq: Once | ORAL | 0 refills | Status: AC

## 2019-08-01 NOTE — Progress Notes (Signed)
Patient is here in-person for PV, and pt's son is here also (temp 96.8). Patient denies any allergies to eggs or soy. Patient denies any problems with anesthesia/sedation. Patient denies any oxygen use at home. Patient denies taking any diet/weight loss medications or blood thinners. Patient is not being treated for MRSA or C-diff. EMMI education assisgned to patient on colonoscopy, this was explained and instructions given to patient.   Pt is aware that care partner will wait in the car during procedure; if they feel like they will be too hot to wait in the car; they may wait in the lobby.  We want them to wear a mask (we do not have any that we can provide them), practice social distancing, and we will check their temperatures when they get here.  I did remind patient that their care partner needs to stay in the parking lot the entire time. Pt will wear mask into building.

## 2019-08-05 ENCOUNTER — Telehealth: Payer: Self-pay

## 2019-08-05 NOTE — Telephone Encounter (Signed)
Covid-19 screening questions   Do you now or have you had a fever in the last 14 days? NO   Do you have any respiratory symptoms of shortness of breath or cough now or in the last 14 days? NO  Do you have any family members or close contacts with diagnosed or suspected Covid-19 in the past 14 days? NO  Have you been tested for Covid-19 and found to be positive? NO        

## 2019-08-06 ENCOUNTER — Encounter: Payer: Self-pay | Admitting: Gastroenterology

## 2019-08-06 ENCOUNTER — Ambulatory Visit (AMBULATORY_SURGERY_CENTER): Payer: BC Managed Care – PPO | Admitting: Gastroenterology

## 2019-08-06 ENCOUNTER — Other Ambulatory Visit: Payer: Self-pay

## 2019-08-06 ENCOUNTER — Other Ambulatory Visit: Payer: Self-pay | Admitting: Gastroenterology

## 2019-08-06 VITALS — BP 108/72 | HR 63 | Temp 97.8°F | Resp 11 | Ht 78.0 in | Wt 206.0 lb

## 2019-08-06 DIAGNOSIS — R197 Diarrhea, unspecified: Secondary | ICD-10-CM

## 2019-08-06 DIAGNOSIS — Z1211 Encounter for screening for malignant neoplasm of colon: Secondary | ICD-10-CM

## 2019-08-06 DIAGNOSIS — R195 Other fecal abnormalities: Secondary | ICD-10-CM

## 2019-08-06 MED ORDER — SODIUM CHLORIDE 0.9 % IV SOLN: 500.0000 mL | Freq: Once | INTRAVENOUS | Status: DC

## 2019-08-06 NOTE — Progress Notes (Signed)
Pt's states no medical or surgical changes since previsit or office visit. 

## 2019-08-06 NOTE — Progress Notes (Signed)
Called to room to assist during endoscopic procedure.  Patient ID and intended procedure confirmed with present staff. Received instructions for my participation in the procedure from the performing physician.  

## 2019-08-06 NOTE — Progress Notes (Signed)
To PACU, VSS. Report to [email or web address] 

## 2019-08-06 NOTE — Patient Instructions (Signed)
Thank you for allowing [place] to care for you today!  Await pathology results for microscopic colitis, approximately 2 weeks.  Recommend next screening colonoscopy in 10 years.  Resume previous diet and medications today.  Return to your normal activities tomorrow.      YOU HAD AN ENDOSCOPIC PROCEDURE TODAY AT [facility]:   Refer to the procedure report that was given to you for any specific questions about what was found during the examination.  If the procedure report does not answer your questions, please call your gastroenterologist to clarify.  If you requested that your care partner not be given the details of your procedure findings, then the procedure report has been included in a sealed envelope for you to review at your convenience later.  YOU SHOULD EXPECT: Some feelings of bloating in the abdomen. Passage of more gas than usual.  Walking can help get rid of the air that was put into your GI tract during the procedure and reduce the bloating. If you had a lower endoscopy (such as a colonoscopy or flexible sigmoidoscopy) you may notice spotting of blood in your stool or on the toilet paper. If you underwent a bowel prep for your procedure, you may not have a normal bowel movement for a few days.  Please Note:  You might notice some irritation and congestion in your nose or some drainage.  This is from the oxygen used during your procedure.  There is no need for concern and it should clear up in a day or so.  SYMPTOMS TO REPORT IMMEDIATELY:   Following lower endoscopy (colonoscopy or flexible sigmoidoscopy):  Excessive amounts of blood in the stool  Significant tenderness or worsening of abdominal pains  Swelling of the abdomen that is new, acute  Fever of 100F or higher    For urgent or emergent issues, a gastroenterologist can be reached at any hour by calling [phone].   DIET:  We do recommend a small meal at first, but then you may proceed to your  regular diet.  Drink plenty of fluids but you should avoid alcoholic beverages for 24 hours.  ACTIVITY:  You should plan to take it easy for the rest of today and you should NOT DRIVE or use heavy machinery until tomorrow (because of the sedation medicines used during the test).    FOLLOW UP: Our staff will call the number listed on your records 48-72 hours following your procedure to check on you and address any questions or concerns that you may have regarding the information given to you following your procedure. If we do not reach you, we will leave a message.  We will attempt to reach you two times.  During this call, we will ask if you have developed any symptoms of COVID 19. If you develop any symptoms (ie: fever, flu-like symptoms, shortness of breath, cough etc.) before then, please call [phone].  If you test positive for Covid 19 in the 2 weeks post procedure, please call and report this information to [place].    If any biopsies were taken you will be contacted by phone or by letter within the next 1-3 weeks.  Please call us at [phone] if you have not heard about the biopsies in 3 weeks.    SIGNATURES/CONFIDENTIALITY: You and/or your care partner have signed paperwork which will be entered into your electronic medical record.  These signatures attest to the fact that that the information above on your After Visit Summary has  been reviewed and is understood.  Full responsibility of the confidentiality of this discharge information lies with you and/or your care-partner.

## 2019-08-06 NOTE — Op Note (Signed)
[facility] Patient Name: Sean Ray Procedure Date: 08/06/2019 9:54 AM MRN: [id number] Endoscopist: Milus Banister , MD Age: 48 Referring MD:  Date of Birth: 1971-06-21 Gender: Male Account #: [id number] Procedure:                Colonoscopy Indications:              Screening for colorectal malignant neoplasm;                            chronic loose stools Medicines:                Monitored Anesthesia Care Procedure:                Pre-Anesthesia Assessment:                           - Prior to the procedure, a History and Physical                            was performed, and patient medications and                            allergies were reviewed. The patient's tolerance of                            previous anesthesia was also reviewed. The risks                            and benefits of the procedure and the sedation                            options and risks were discussed with the patient.                            All questions were answered, and informed consent                            was obtained. Prior Anticoagulants: The patient has                            taken no previous anticoagulant or antiplatelet                            agents. ASA Grade Assessment: II - A patient with                            mild systemic disease. After reviewing the risks                            and benefits, the patient was deemed in                            satisfactory condition to undergo the procedure.  After obtaining informed consent, the colonoscope                            was passed under direct vision. Throughout the                            procedure, the patient's blood pressure, pulse, and                            oxygen saturations were monitored continuously. The                            Colonoscope was introduced through the anus and                            advanced to the the terminal ileum. The  colonoscopy                            was performed without difficulty. The patient                            tolerated the procedure well. The quality of the                            bowel preparation was good. The terminal ileum,                            ileocecal valve, appendiceal orifice, and rectum                            were photographed. Scope In: 10:12:06 AM Scope Out: 10:24:33 AM Scope Withdrawal Time: 0 hours 7 minutes 6 seconds  Total Procedure Duration: 0 hours 12 minutes 27 seconds  Findings:                 The terminal ileum appeared normal.                           The entire examined colon appeared normal on direct                            and retroflexion views.                           Biopsies for histology were taken with a cold                            forceps from the entire colon for evaluation of                            microscopic colitis. Complications:            No immediate complications. Estimated blood loss:                            None. Estimated  Blood Loss:     Estimated blood loss: none. Impression:               - The examined portion of the ileum was normal.                           - The entire examined colon is normal on direct and                            retroflexion views.                           - Biopsies were taken with a cold forceps from the                            entire colon for evaluation of microscopic colitis. Recommendation:           - Patient has a contact number available for                            emergencies. The signs and symptoms of potential                            delayed complications were discussed with the                            patient. Return to normal activities tomorrow.                            Written discharge instructions were provided to the                            patient.                           - Resume previous diet.                           - Continue  present medications.                           - Await pathology results.                           - Repeat colonoscopy in 10 years for screening. Milus Banister, MD 08/06/2019 10:27:16 AM This report has been signed electronically.

## 2019-08-08 ENCOUNTER — Telehealth: Payer: Self-pay | Admitting: *Deleted

## 2019-08-08 NOTE — Telephone Encounter (Signed)
  Follow up Call-  Call back number 08/06/2019  Post procedure Call Back phone  # [phone]  Permission to leave phone message Yes  Some recent data might be hidden     Patient questions:  Do you have a fever, pain , or abdominal swelling? No. Pain Score  0 *  Have you tolerated food without any problems? Yes.    Have you been able to return to your normal activities? Yes.    Do you have any questions about your discharge instructions: Diet   No. Medications  No. Follow up visit  No.  Do you have questions or concerns about your Care? No.  Actions: * If pain score is 4 or above: No action needed, pain <4.   1. Have you developed a fever since your procedure? no  2.   Have you had an respiratory symptoms (SOB or cough) since your procedure? no  3.   Have you tested positive for COVID 19 since your procedure no  4.   Have you had any family members/close contacts diagnosed with the COVID 19 since your procedure?  no   If yes to any of these questions please route to Joylene John, RN and Alphonsa Gin, Therapist, sports.

## 2019-08-13 ENCOUNTER — Encounter: Payer: Self-pay | Admitting: Gastroenterology

## 2020-01-14 ENCOUNTER — Encounter: Payer: Self-pay | Admitting: Sports Medicine

## 2020-01-14 ENCOUNTER — Ambulatory Visit: Payer: BC Managed Care – PPO | Admitting: Sports Medicine

## 2020-01-14 ENCOUNTER — Other Ambulatory Visit: Payer: Self-pay

## 2020-01-14 VITALS — BP 122/70 | Ht 78.0 in | Wt 210.0 lb

## 2020-01-14 DIAGNOSIS — M25562 Pain in left knee: Secondary | ICD-10-CM

## 2020-01-14 DIAGNOSIS — M533 Sacrococcygeal disorders, not elsewhere classified: Secondary | ICD-10-CM | POA: Diagnosis not present

## 2020-01-14 DIAGNOSIS — G8929 Other chronic pain: Secondary | ICD-10-CM | POA: Diagnosis not present

## 2020-01-14 NOTE — Progress Notes (Signed)
[facility] [address] [place], [place] [address] Phone: [phone] Fax: [phone]   Patient Name: Sean Ray Date of Birth: 1971/08/27 Medical Record Number: [id number] Gender: male Date of Encounter: 01/14/2020  SUBJECTIVE:      Chief Complaint:  Acute left knee pain and chronic right low back pain   HPI:  Sean Ray is a 49 year old male presenting with 3 weeks of left knee pain.  He was skiing and fell, his foot did not disengage from his ski and he felt his knee twist.  Initially had a lot of pain, but denies any swelling or weakness.  Over the 3 weeks he has noticed instability when moving laterally.  Denies any previous injury to the knee.  Sean Ray has chronically been dealing with right SI joint pain after a cross-country injury from college.  He is having pain in the lower thoracic paraspinal region on the right.  He completed physical therapy in the past that helped.       ROS:     See HPI.   PERTINENT  PMH / PSH / FH / SH:  Past Medical, Surgical, Social, and Family History Reviewed & Updated in the EMR. Pertinent findings include:  Neuropathy, bipolar disorder   OBJECTIVE:  BP 122/70   Ht 6\' 6"  (1.981 m)   Wt 210 lb (95.3 kg)   BMI 24.27 kg/m  Physical Exam:  Vital signs are reviewed.   GEN: Alert and oriented, NAD Pulm: Breathing unlabored PSY: normal mood, congruent affect  MSK: Left knee: Normal to inspection with no erythema or effusion or obvious bony abnormalities. Palpation normal with no warmth, joint line tenderness, patellar tenderness, or condyle tenderness. ROM full in flexion and extension and lower leg rotation. Ligaments with solid consistent endpoints including PCL, LCL, MCL. Lachman's has increased laxity compared to contralateral knee Negative Mcmurray's and Thessaly tests. Non painful patellar compression. Patellar glide without crepitus. Patellar and quadriceps tendons  unremarkable. Hamstring and quadriceps strength is normal.  Neurovascularly intact.  Right low back No swelling or erythema Mild TTP at right lower thoracic paraspinal musculature with spasm No pain with resisted trunk rotation Full strength at hip Negative straight leg raise Mild pain with resisted hip internal rotation in Faber's position  MSK left Limited knee ultrasound performed,  Suprapatellar pouch visualized in long and short axis with no abnormality. Quadriceps tendon visualized in both long and short axis without abnormality. Patellar Tendon is visualized in long and short axis without abnormality Medial meniscus and MCL visualized with no abnormality Lateral mensicus and LCL visualized with no abnormality.  Of note lateral meniscus has large vessel confirmed with Doppler flow.   IMPRESSION:  Normal knee ultrasound  Ultrasound and interpretation by Dr. Kathrynn Ray and Sean Ray. Fields, MD   ASSESSMENT & PLAN:   1. Left knee pain  Given the lack of swelling and normal US findings today, we will start patient with conservative therapy focusing on quad strengthening.  He was also fitted with a body helix compression sleeve.  We will see him back in 6 weeks for reassessment.  2.  Chronic right SI joint pain  Likely an exacerbation of his prior injury.  We will start patient with home exercise program focusing on trunk rotation and hip mobility.  We can reassess at his follow-up in 6 weeks.   Sean Clam, DO, ATC Sports Medicine Fellow I observed and examined the patient with Dr. Kathrynn Ray and agree with assessment and plan.  Note reviewed and  modified by me. Sean Mcgill, MD

## 2020-01-25 ENCOUNTER — Ambulatory Visit: Payer: BC Managed Care – PPO | Attending: Internal Medicine

## 2020-01-25 DIAGNOSIS — Z23 Encounter for immunization: Secondary | ICD-10-CM

## 2020-01-25 NOTE — Progress Notes (Signed)
   Covid-19 Vaccination Clinic  Name:  Sean Ray    MRN: [id number] DOB: 1971-03-05  01/25/2020  [name] was observed post Covid-19 immunization for 15 minutes without incident. He was provided with Vaccine Information Sheet and instruction to access the V-Safe system.   [name] was instructed to call 911 with any severe reactions post vaccine: Marland Kitchen Difficulty breathing  . Swelling of face and throat  . A fast heartbeat  . A bad rash all over body  . Dizziness and weakness   Immunizations Administered    Name Date Dose VIS Date Route   Pfizer COVID-19 Vaccine 01/25/2020 10:05 AM 0.3 mL 10/25/2019 Intramuscular   Manufacturer: [place]   Lot: [id number]   [place]: [id number]

## 2020-02-19 ENCOUNTER — Ambulatory Visit: Payer: BC Managed Care – PPO | Attending: Internal Medicine

## 2020-02-19 DIAGNOSIS — Z23 Encounter for immunization: Secondary | ICD-10-CM

## 2020-02-19 NOTE — Progress Notes (Signed)
   Covid-19 Vaccination Clinic  Name:  Tajai Lennartz    MRN: [id number] DOB: 10/05/1971  02/19/2020  Mr. Stachurski was observed post Covid-19 immunization for 15 minutes without incident. He was provided with Vaccine Information Sheet and instruction to access the V-Safe system.   Mr. Brockett was instructed to call 911 with any severe reactions post vaccine: Marland Kitchen Difficulty breathing  . Swelling of face and throat  . A fast heartbeat  . A bad rash all over body  . Dizziness and weakness   Immunizations Administered    Name Date Dose VIS Date Route   Pfizer COVID-19 Vaccine 02/19/2020  9:49 AM 0.3 mL 10/25/2019 Intramuscular   Manufacturer: [place]   Lot: [id number]   [place]: [id number]

## 2020-02-20 ENCOUNTER — Ambulatory Visit: Payer: BC Managed Care – PPO | Admitting: Sports Medicine

## 2020-02-25 ENCOUNTER — Ambulatory Visit: Payer: BC Managed Care – PPO | Admitting: Sports Medicine

## 2020-03-19 ENCOUNTER — Ambulatory Visit: Payer: BC Managed Care – PPO | Admitting: Sports Medicine

## 2020-04-09 ENCOUNTER — Ambulatory Visit: Payer: BC Managed Care – PPO | Admitting: Sports Medicine

## 2020-04-14 ENCOUNTER — Ambulatory Visit: Payer: BC Managed Care – PPO | Admitting: Sports Medicine

## 2021-06-03 ENCOUNTER — Other Ambulatory Visit: Payer: Self-pay | Admitting: Internal Medicine

## 2021-06-03 DIAGNOSIS — Z0000 Encounter for general adult medical examination without abnormal findings: Secondary | ICD-10-CM

## 2021-07-01 ENCOUNTER — Other Ambulatory Visit: Payer: BC Managed Care – PPO

## 2021-07-16 ENCOUNTER — Ambulatory Visit
Admission: RE | Admit: 2021-07-16 | Discharge: 2021-07-16 | Disposition: A | Discharge status: Home / Self Care | Payer: No Typology Code available for payment source | Source: Ambulatory Visit | Attending: Internal Medicine | Admitting: Internal Medicine

## 2021-07-16 ENCOUNTER — Other Ambulatory Visit: Payer: Self-pay

## 2021-07-16 DIAGNOSIS — Z0000 Encounter for general adult medical examination without abnormal findings: Secondary | ICD-10-CM

## 2021-12-28 ENCOUNTER — Other Ambulatory Visit: Payer: Self-pay | Admitting: Internal Medicine

## 2021-12-28 DIAGNOSIS — E221 Hyperprolactinemia: Secondary | ICD-10-CM

## 2022-01-20 ENCOUNTER — Other Ambulatory Visit: Payer: No Typology Code available for payment source

## 2022-01-27 ENCOUNTER — Other Ambulatory Visit: Payer: Self-pay

## 2022-01-27 ENCOUNTER — Other Ambulatory Visit: Payer: No Typology Code available for payment source

## 2022-01-27 ENCOUNTER — Ambulatory Visit
Admission: RE | Admit: 2022-01-27 | Discharge: 2022-01-27 | Disposition: A | Discharge status: Home / Self Care | Payer: BC Managed Care – PPO | Source: Ambulatory Visit | Attending: Internal Medicine | Admitting: Internal Medicine

## 2022-01-27 DIAGNOSIS — E221 Hyperprolactinemia: Secondary | ICD-10-CM

## 2022-01-27 MED ORDER — GADOBENATE DIMEGLUMINE 529 MG/ML IV SOLN
10.0000 mL | Freq: Once | INTRAVENOUS | Status: AC | PRN
  Administered 2022-01-27: 10 mL via INTRAVENOUS

## 2022-02-01 ENCOUNTER — Other Ambulatory Visit: Payer: No Typology Code available for payment source

## 2022-08-29 IMAGING — MR MR HEAD WO/W CM
16 of 21 series · 35 of 48 positions shown · IV contrast (multihance)
Comparison: Head CT 03/09/2009

CLINICAL DATA: Elevated prolactin.

EXAM:
MRI HEAD WITHOUT AND WITH CONTRAST
TECHNIQUE: Multiplanar, multiecho pulse sequences of the brain and surrounding
structures were obtained without and with intravenous contrast.
CONTRAST:  10mL MULTIHANCE GADOBENATE DIMEGLUMINE 529 MG/ML IV SOLN

[Series 2: T1 · sagittal · 5.0mm · 0.45mm/px · 1 of 24 slices shown (1 of 3)]
[im 1/24]
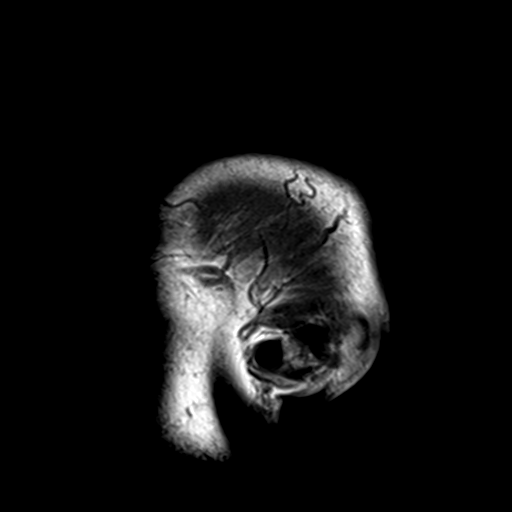

[Series 3: DWI · axial · 3.0mm · 1.80mm/px · z∈[-72,+87]mm · 9 of 110 slices shown]
[im 1/110]
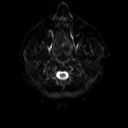
[im 14/110]
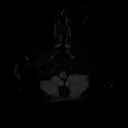
[im 28/110]
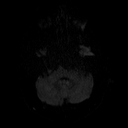
[im 41/110]
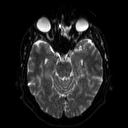
[im 55/110]
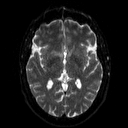
[im 69/110]
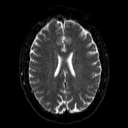
[im 82/110]
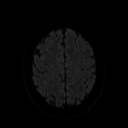
[im 96/110]
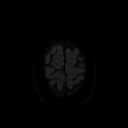
[im 110/110]
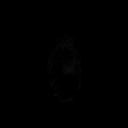

[Series 4: dwi_adc · axial · 3.0mm · 1.80mm/px · z∈[-72,+87]mm · 4 of 55 slices shown]
[im 1/55]
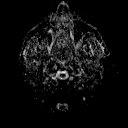
[im 19/55]
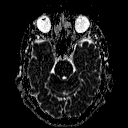
[im 37/55]
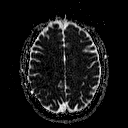
[im 55/55]
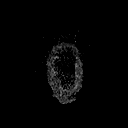

[Series 5: T2 · axial · 5.0mm · 0.36mm/px · z∈[-68,+85]mm · 2 of 25 slices shown (1 of 2)]
[im 1/25]
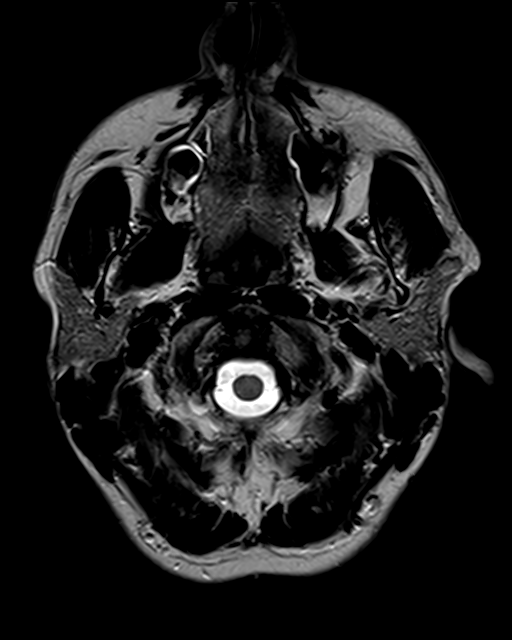
[im 25/25]
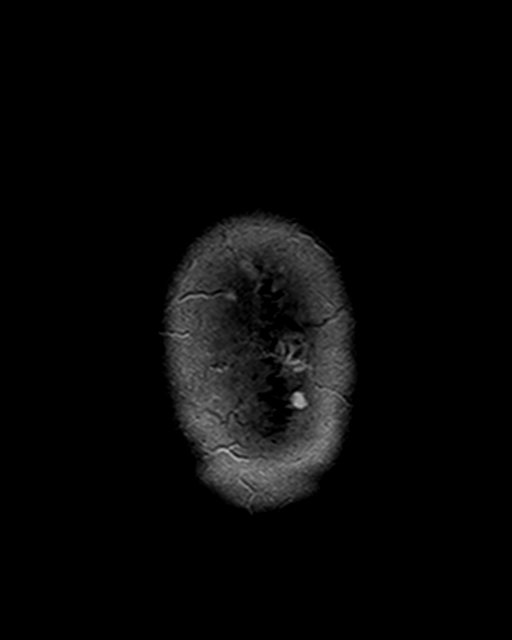

[Series 6: FLAIR · axial · 3.0mm · 0.45mm/px · z∈[-66,+81]mm · 3 of 33 slices shown]
[im 1/33]
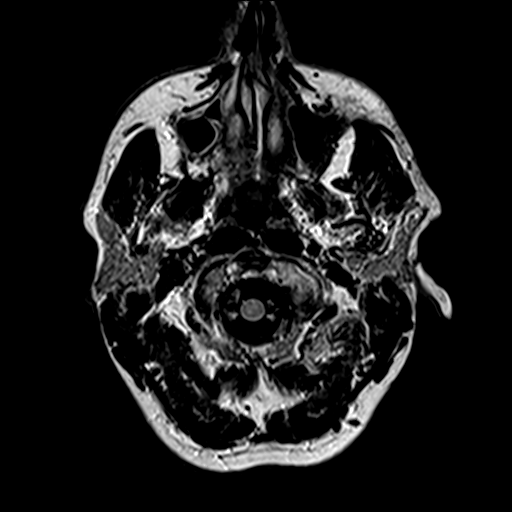
[im 17/33]
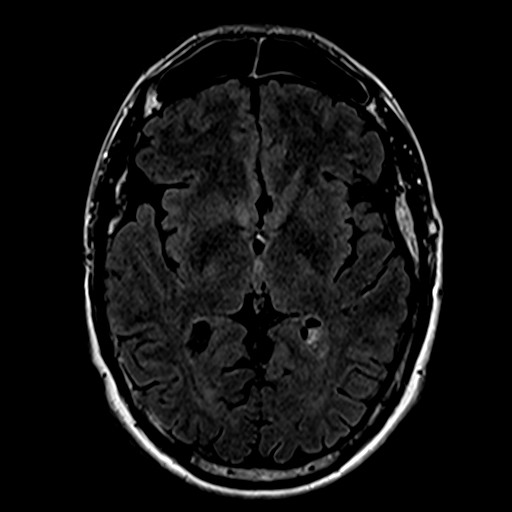
[im 33/33]
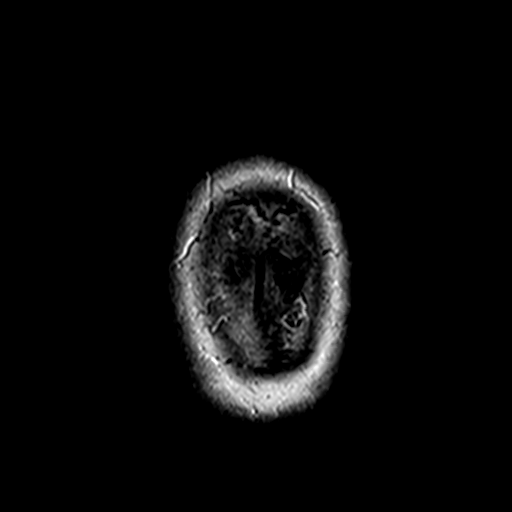

[Series 7: mip_images(sw) · axial · 32.0mm · 0.94mm/px · z∈[-47,+63]mm · 3 of 29 slices shown]
[im 1/29]
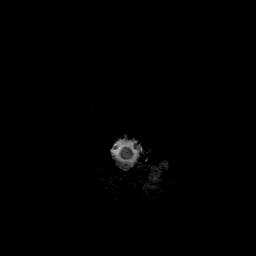
[im 15/29]
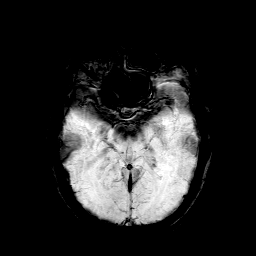
[im 29/29]
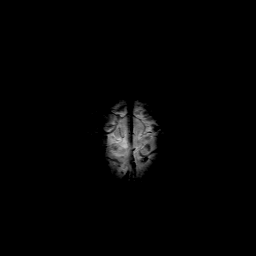

[Series 8: swi_images · axial · 4.0mm · 0.94mm/px · z∈[-61,+77]mm · 3 of 36 slices shown]
[im 1/36]
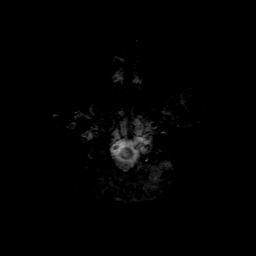
[im 18/36]
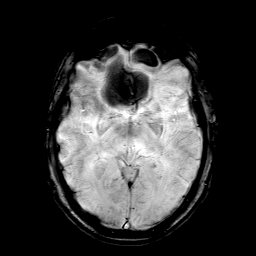
[im 36/36]
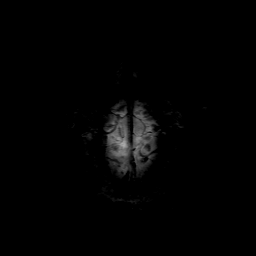

[Series 9: T1 · sagittal · 3.0mm · 0.33mm/px · 1 of 11 slices shown (2 of 3)]
[im 1/11]
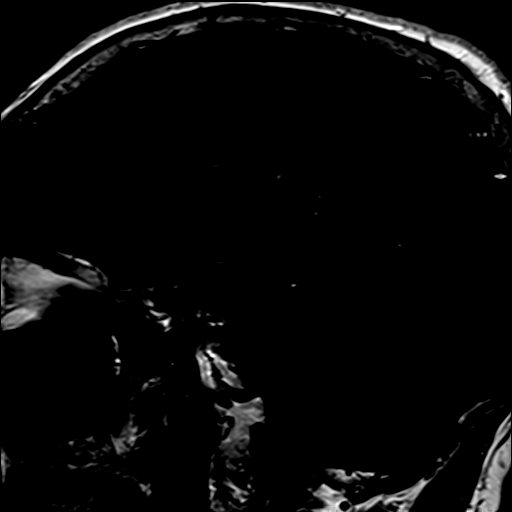

[Series 10: T1 · coronal · 3.0mm · 0.33mm/px · 1 of 11 slices shown (3 of 3)]
[im 1/11]
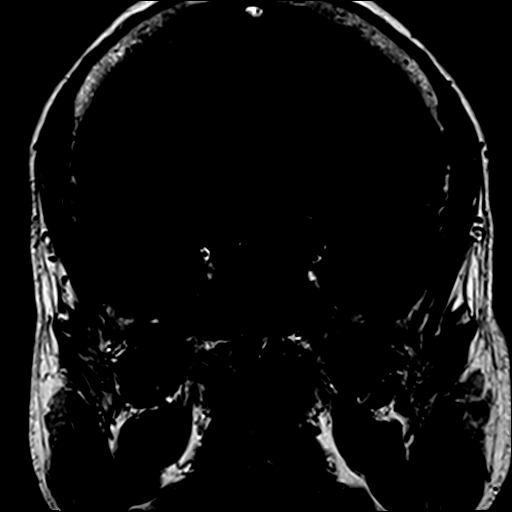

[Series 11: T2 · coronal · 3.0mm · 0.23mm/px · 1 of 11 slices shown (2 of 2)]
[im 1/11]
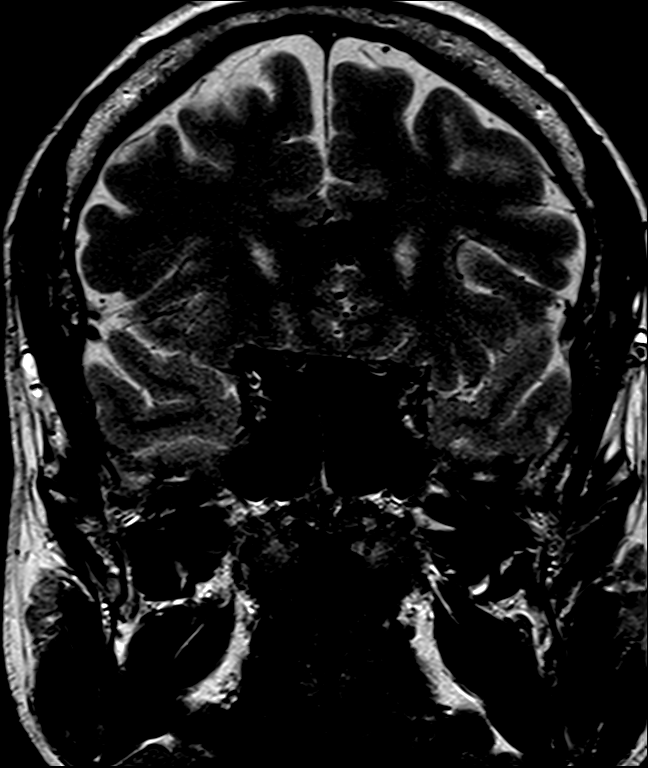

[Series 13: post fs cor · coronal · 3.0mm · 0.35mm/px · 1 of 7 slices shown (1 of 3)]
[im 1/7]
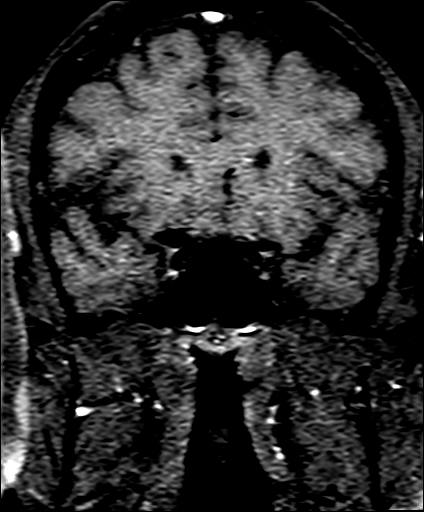

[Series 14: post fs cor · coronal · 3.0mm · 0.35mm/px · 1 of 7 slices shown (2 of 3)]
[im 1/7]
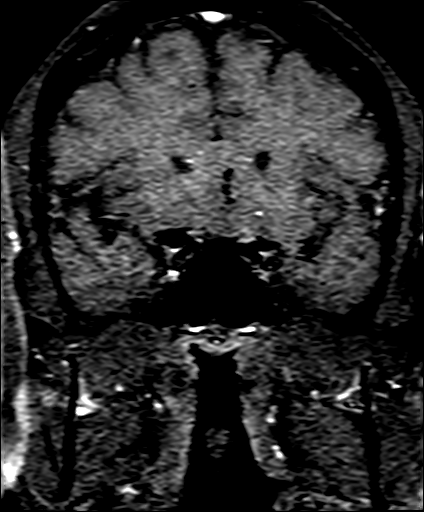

[Series 15: post fs cor · coronal · 3.0mm · 0.35mm/px · 1 of 7 slices shown (3 of 3)]
[im 1/7]
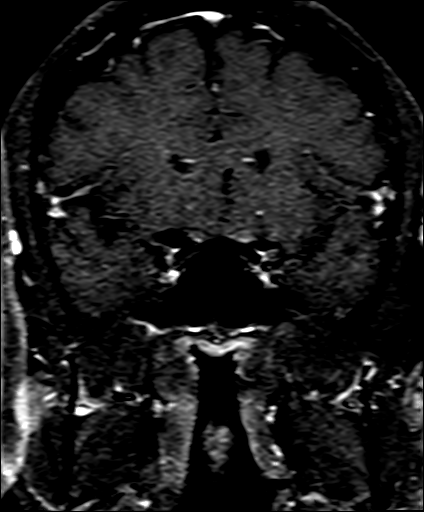

[Series 20: T1 post-contrast · sagittal · 3.0mm · 0.33mm/px · 1 of 11 slices shown (1 of 3)]
[im 1/11]
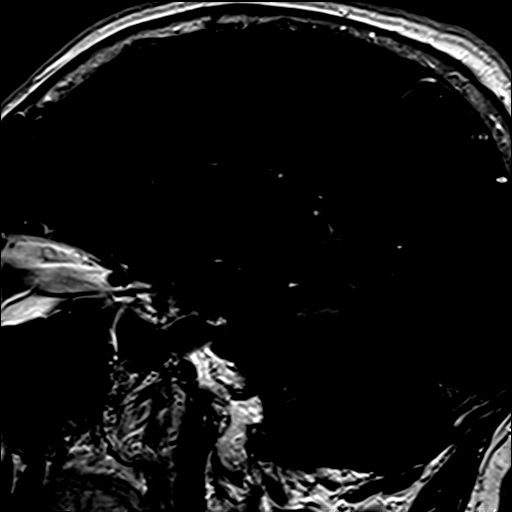

[Series 21: T1 post-contrast · coronal · 3.0mm · 0.33mm/px · 1 of 11 slices shown (2 of 3)]
[im 1/11]
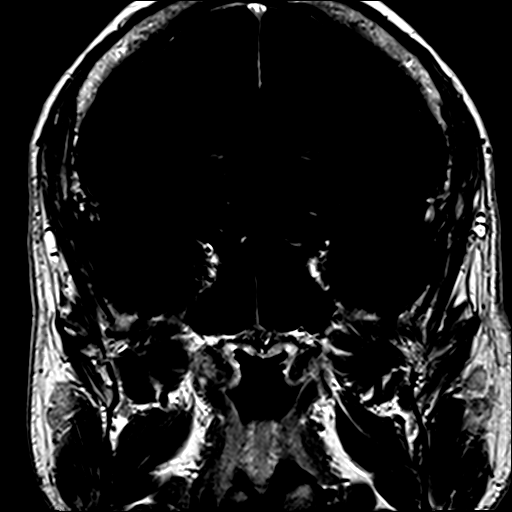

[Series 23: T1 post-contrast · coronal · 5.0mm · 0.45mm/px · 2 of 28 slices shown (3 of 3)]
[im 1/28]
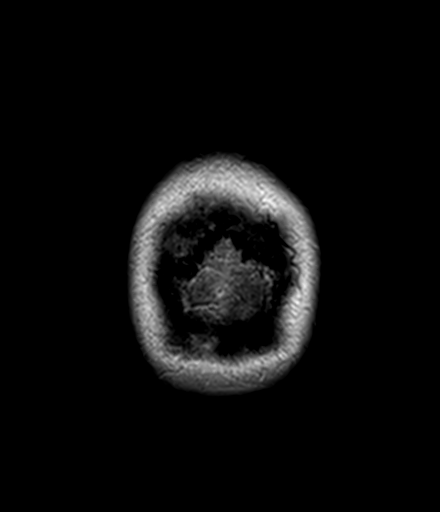
[im 28/28]
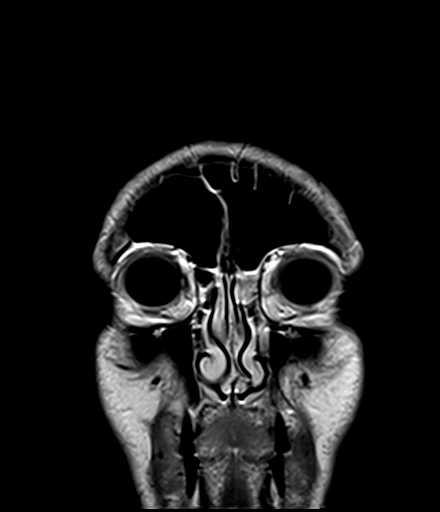

[35 of 48 positions shown; findings below may reference images not displayed]

FINDINGS: Brain: Normal size and shape of the pituitary gland with homogeneous
enhancement. Unremarkable infundibulum, chiasm, and cavernous
sinuses. The brain shows no infarct, hemorrhage, hydrocephalus, or
mass. Negative for collection.

Vascular: Normal flow voids and vascular enhancements

Skull and upper cervical spine: Normal marrow signal

Sinuses/Orbits: Mild patchy sinus opacification.
IMPRESSION: Normal MRI of the brain and pituitary gland.

## 2023-03-21 ENCOUNTER — Ambulatory Visit: Payer: BC Managed Care – PPO | Admitting: Podiatry

## 2023-03-23 ENCOUNTER — Encounter: Payer: Self-pay | Admitting: Nurse Practitioner

## 2023-03-23 ENCOUNTER — Ambulatory Visit: Payer: BC Managed Care – PPO | Admitting: Nurse Practitioner

## 2023-03-23 ENCOUNTER — Telehealth: Payer: Self-pay | Admitting: Nurse Practitioner

## 2023-03-23 VITALS — BP 124/78 | HR 72 | Ht 78.0 in | Wt 220.2 lb

## 2023-03-23 DIAGNOSIS — K625 Hemorrhage of anus and rectum: Secondary | ICD-10-CM | POA: Diagnosis not present

## 2023-03-23 DIAGNOSIS — K649 Unspecified hemorrhoids: Secondary | ICD-10-CM

## 2023-03-23 DIAGNOSIS — Z800 Family history of malignant neoplasm of digestive organs: Secondary | ICD-10-CM | POA: Diagnosis not present

## 2023-03-23 MED ORDER — HYDROCORTISONE ACETATE 25 MG RE SUPP: 25.0000 mg | Freq: Every day | RECTAL | 0 refills | Status: DC

## 2023-03-23 MED ORDER — NA SULFATE-K SULFATE-MG SULF 17.5-3.13-1.6 GM/177ML PO SOLN: 1.0000 | Freq: Once | ORAL | 0 refills | Status: AC

## 2023-03-23 NOTE — Telephone Encounter (Signed)
Spoke with patient & advised him that an updated copy of instructions have been sent to his mychart. He verbalized all understanding and had no further questions.

## 2023-03-23 NOTE — Progress Notes (Signed)
03/23/2023 Sean Ray [birth year] [id number]  COMPLAINT:  Rectal bleeding   HISTORY OF PRESENT ILLNESS: Sean Ray is a 52 year old male with a past medical history of anxiety, depression, peripheral neuropathy and hypertension. He presents to our office today self referred for further evaluation regarding rectal bleeding. He describes seeing bright red blood on the stool and a few times he saw drops of blood on  the toilet seat which has occurred once or twice weekly for the past few months. No anorectal pain. He has chronic loose stools. He underwent a colonoscopy 11/2008 and 07/2019 due to having chronic diarrhea without rectal bleeding which did not show any evidence of microscopic colitis, inflammatory bowel disease or colon polyps. Father with history of colon polyps.  No known family history of colorectal cancer or IBD.  He typically passes 2 loose bowel movements daily which is normal bowel pattern.  Negative celiac serology in 2009. No NSAID use. No other complaints at this time.  Labs reviewed per PCP 03/09/2023: WBC 7.9.  Hemoglobin 15.  Hematocrit 44.6.  Platelet 220. Labs 10/16/2008: Lactoferrin negative. Labs 10/07/2008: IgA 90. Sed rate 5. tTG IgA 0.0.      Colonoscopy 08/06/2019 by Dr. Christella Hartigan due to having chronic diarrhea: - The examined portion of the ileum was normal.  - The entire examined colon is normal on direct and retroflexion views. - Biopsies were taken with a cold forceps from the entire colon for evaluation of microscopic colitis - Recall colonoscopy 10 years  - BENIGN COLONIC MUCOSA. - NO ACTIVE INFLAMMATION OR EVIDENCE OF MICROSCOPIC COLITIS. - NO DYSPLASIA OR MALIGNANCY.  Colonoscopy 11/25/2008: Mild erythema in colon Normal terminal ileum Otherwise normal examination Very mild erythema in majority of colon.  Normal terminal ileum.   Biopsies taken to check for chronic colitis. COLON, RANDOM BIOPSIES: BENIGN COLONIC MUCOSA. NO ACTIVE   INFLAMMATION,  MICROSCOPIC COLITIS OR GRANULOMAS.    Past Medical History:  Diagnosis Date   Anxiety    Bipolar disorder (HCC)    Blood transfusion without reported diagnosis    at age 74    Chronic kidney disease    Acute Kidney injury per pt. 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  He has 1 son.  He is a Set designer at [employer].  Non-smoker.  No alcohol use.  No drug use.  Family History: Father with history of colon polyps and Parkinson's disease. Mother diagnosed with breast cancer in her 32's, died from old age. No known family history of esophageal, gastric or colon cancer.  Allergies  Allergen Reactions   Penicillins Swelling    REACTION: as child, swelling      Outpatient Encounter Medications as of 03/23/2023  Medication Sig   Coenzyme Q10 (CO Q 10 PO) Take by mouth.   lamoTRIgine (LAMICTAL) 200 MG tablet Take 200 mg by mouth daily.     lisinopril (PRINIVIL,ZESTRIL) 10 MG tablet Take 1 tablet (10 mg total) by mouth daily.   Multiple Vitamin (MULTIVITAMIN) tablet Take 1 tablet by mouth daily.   risperiDONE (RISPERDAL) 1 MG tablet Take by mouth.   vitamin C (ASCORBIC ACID) 500 MG tablet Take 500 mg by mouth  daily.   No facility-administered encounter medications on file as of 03/23/2023.    REVIEW OF SYSTEMS:  Gen: Denies fever, sweats or chills. No weight loss.  CV: Denies chest pain, palpitations or edema. Resp: Denies cough, shortness of breath of hemoptysis.  GI: See HPI. No GERD symptoms. GU : Denies urinary burning, blood in urine, increased urinary frequency or incontinence. MS: Denies joint pain, muscles aches or weakness. Derm: Denies rash, itchiness, skin lesions or unhealing ulcers. Psych: Denies  depression, anxiety, memory loss or confusion. Heme: Denies bruising, easy bleeding. Neuro:  Denies headaches, dizziness or paresthesias. Endo:  Denies any problems with DM, thyroid or adrenal function.  PHYSICAL EXAM: Ht 6\' 6"  (1.981 m)   Wt 220 lb 4 oz (99.9 kg)   BMI 25.45 kg/m   General: 67 yer old male in no acute distress. Head: Normocephalic and atraumatic. Eyes:  Sclerae non-icteric, conjunctive pink. Ears: Normal auditory acuity. Mouth: Dentition intact. No ulcers or lesions.  Neck: Supple, no lymphadenopathy or thyromegaly.  Lungs: Clear bilaterally to auscultation without wheezes, crackles or rhonchi. Heart: Regular rate and rhythm. No murmur, rub or gallop appreciated.  Abdomen: Soft, nontender, nondistended. No masses. No hepatosplenomegaly. Normoactive bowel sounds x 4 quadrants.  Rectal: No external hemorrhoids. Internal hemorrhoids present without prolapse. No blood or stool in the rectal vault. Tisha CMA present during exam. Musculoskeletal: Symmetrical with no gross deformities. Skin: Warm and dry. No rash or lesions on visible extremities. Extremities: No edema. Neurological: Alert oriented x 4, no focal deficits.  Psychological:  Alert and cooperative. Normal mood and affect.  ASSESSMENT AND PLAN:  52 year old male with new intermittent rectal bleeding for the past few months. Normal colonoscopies 11/2008 and 07/2019. Father with history of colon polyps.  -Diagnostic colonoscopy benefits and risks discussed including risk with sedation, risk of bleeding, perforation and infection  - Apply a small amount of Desitin inside the anal opening and to the external anal area three times daily as needed for anal or hemorrhoidal irritation/bleeding.   Chronic loose stools since at least 2009. Negative celiac serology 2009. Colonoscopies 2020 and 2020 without evidence of colitis/IBD. -Benefiber one tablespoon daily as tolerated -Imodium one tab po once daily PRN -Follow up  after colonoscopy completed          CC:  Charlane Ferretti, DO

## 2023-03-23 NOTE — Progress Notes (Signed)
Agree with the assessment and plan as outlined by Alcide Evener, NP. Will plan for expedited colonoscopy for evaluation of new LGI sxs as outlined.    Doristine Locks, DO, [facility]

## 2023-03-23 NOTE — Telephone Encounter (Signed)
Patient called to schedule colonoscopy on 05/13 that was offered in office today 05/09. He is scheduled for then, requesting a call back to see if any prep has to be done sooner. Please advise, thank you.

## 2023-03-23 NOTE — Patient Instructions (Addendum)
Benefiber 1 tablespoon daily to bulk up stool  Apply a small amount of Desitin inside the anal opening and to the external anal area three times daily as needed for anal or hemorrhoidal irritation/bleeding.   You have been scheduled for a colonoscopy. Please follow written instructions given to you at your visit today.  Please pick up your prep supplies at the pharmacy within the next 1-3 days. If you use inhalers (even only as needed), please bring them with you on the day of your procedure.   Due to recent changes in healthcare laws, you may see the results of your imaging and laboratory studies on MyChart before your provider has had a chance to review them.  We understand that in some cases there may be results that are confusing or concerning to you. Not all laboratory results come back in the same time frame and the provider may be waiting for multiple results in order to interpret others.  Please give [place] 48 hours in order for your provider to thoroughly review all the results before contacting the office for clarification of your results.    Thank you for trusting me with your gastrointestinal care!   Alcide Evener, CRNP

## 2023-03-25 ENCOUNTER — Encounter: Payer: Self-pay | Admitting: Certified Registered Nurse Anesthetist

## 2023-03-27 ENCOUNTER — Ambulatory Visit: Payer: BC Managed Care – PPO | Admitting: Podiatry

## 2023-03-27 ENCOUNTER — Ambulatory Visit (AMBULATORY_SURGERY_CENTER): Payer: BC Managed Care – PPO | Admitting: Gastroenterology

## 2023-03-27 ENCOUNTER — Encounter: Payer: Self-pay | Admitting: Gastroenterology

## 2023-03-27 VITALS — BP 125/83 | HR 50 | Temp 98.2°F | Resp 13 | Ht 78.0 in | Wt 220.0 lb

## 2023-03-27 DIAGNOSIS — D2371 Other benign neoplasm of skin of right lower limb, including hip: Secondary | ICD-10-CM

## 2023-03-27 DIAGNOSIS — D125 Benign neoplasm of sigmoid colon: Secondary | ICD-10-CM

## 2023-03-27 DIAGNOSIS — K635 Polyp of colon: Secondary | ICD-10-CM | POA: Diagnosis not present

## 2023-03-27 DIAGNOSIS — D123 Benign neoplasm of transverse colon: Secondary | ICD-10-CM | POA: Diagnosis present

## 2023-03-27 DIAGNOSIS — K640 First degree hemorrhoids: Secondary | ICD-10-CM

## 2023-03-27 DIAGNOSIS — K51911 Ulcerative colitis, unspecified with rectal bleeding: Secondary | ICD-10-CM | POA: Diagnosis not present

## 2023-03-27 DIAGNOSIS — K529 Noninfective gastroenteritis and colitis, unspecified: Secondary | ICD-10-CM

## 2023-03-27 DIAGNOSIS — K5730 Diverticulosis of large intestine without perforation or abscess without bleeding: Secondary | ICD-10-CM

## 2023-03-27 DIAGNOSIS — K625 Hemorrhage of anus and rectum: Secondary | ICD-10-CM

## 2023-03-27 MED ORDER — SODIUM CHLORIDE 0.9 % IV SOLN: 500.0000 mL | Freq: Once | INTRAVENOUS | Status: DC

## 2023-03-27 MED ORDER — MESALAMINE 1000 MG RE SUPP: 1000.0000 mg | Freq: Every day | RECTAL | 2 refills | Status: DC

## 2023-03-27 NOTE — Op Note (Signed)
[facility] Patient Name: Sean Ray Procedure Date: 03/27/2023 3:52 PM MRN: [id number] Endoscopist: Doristine Locks , MD, [id number] Age: 52 Referring MD:  Date of Birth: Jun 08, 1971 Gender: Male Account #: [id number] Procedure:                Colonoscopy Indications:              Chronic diarrhea, Hematochezia Medicines:                Monitored Anesthesia Care Procedure:                Pre-Anesthesia Assessment:                           - Prior to the procedure, a History and Physical                            was performed, and patient medications and                            allergies were reviewed. The patient's tolerance of                            previous anesthesia was also reviewed. The risks                            and benefits of the procedure and the sedation                            options and risks were discussed with the patient.                            All questions were answered, and informed consent                            was obtained. Prior Anticoagulants: The patient has                            taken no anticoagulant or antiplatelet agents. ASA                            Grade Assessment: II - A patient with mild systemic                            disease. After reviewing the risks and benefits,                            the patient was deemed in satisfactory condition to                            undergo the procedure.                           After obtaining informed consent, the colonoscope  was passed under direct vision. Throughout the                            procedure, the patient's blood pressure, pulse, and                            oxygen saturations were monitored continuously. The                            CF [id number] #[id number] was introduced through the anus                            and advanced to the the terminal ileum. The                            colonoscopy was performed  without difficulty. The                            patient tolerated the procedure well. The quality                            of the bowel preparation was good. The terminal                            ileum, ileocecal valve, appendiceal orifice, and                            rectum were photographed. Scope In: 4:00:16 PM Scope Out: 4:25:33 PM Scope Withdrawal Time: 0 hours 22 minutes 8 seconds  Total Procedure Duration: 0 hours 25 minutes 17 seconds  Findings:                 The perianal and digital rectal examinations were                            normal.                           A 2 mm polyp was found in the transverse colon. The                            polyp was sessile. The polyp was removed with a                            cold biopsy forceps. Resection and retrieval were                            complete. Estimated blood loss was minimal.                           A 4 mm polyp was found in the sigmoid colon. The                            polyp was sessile. The polyp was  removed with a                            cold snare. Resection was complete but the polyp                            disintegrated in the colonoscope upon retrieval and                            no specimen sent for processing. Estimated blood                            loss was minimal.                           Localized inflammation characterized by congestion                            (edema) and erythema was found in the sigmoid                            colon, located 25-30 cm from the anal verge. This                            was located in an area of dense diverticulosis.                            Biopsies were taken with a cold forceps for                            histology. Estimated blood loss was minimal.                           Localized mild inflammation characterized by                            congestion (edema), erythema, and aphthous                            ulcerations  was found in the distal rectum.                            Biopsies were taken with a cold forceps for                            histology. Estimated blood loss was minimal.                           Multiple medium-mouthed and small-mouthed                            diverticula were found in the sigmoid colon and                            descending  colon.                           There was a small lipoma, in the transverse colon.                           Normal mucosa was found in the proximal sigmoid                            colon, in the descending colon, in the transverse                            colon, in the ascending colon and in the cecum.                            Biopsies were taken with a cold forceps for                            histology. Estimated blood loss was minimal.                           The terminal ileum appeared normal.                           Non-bleeding internal hemorrhoids were found during                            retroflexion. The hemorrhoids were small. Complications:            No immediate complications. Estimated Blood Loss:     Estimated blood loss was minimal. Impression:               - One 2 mm polyp in the transverse colon, removed                            with a cold biopsy forceps. Resected and retrieved.                           - One 4 mm polyp in the sigmoid colon, removed with                            a cold snare. Resected and retrieved.                           - Localized inflammation was found in the sigmoid                            colon. Biopsied.                           - Localized mild inflammation was found in the                            distal rectum. Biopsied.                           -  Diverticulosis in the sigmoid colon.                           - Small lipoma in the transverse colon.                           - Normal mucosa in the proximal sigmoid colon, in                            the  descending colon, in the transverse colon, in                            the ascending colon and in the cecum. Biopsied.                           - The examined portion of the ileum was normal.                           - The GI Genius (intelligent endoscopy module),                            computer-aided polyp detection system powered by AI                            was utilized to detect colorectal polyps through                            enhanced visualization during colonoscopy. Recommendation:           - Patient has a contact number available for                            emergencies. The signs and symptoms of potential                            delayed complications were discussed with the                            patient. Return to normal activities tomorrow.                            Written discharge instructions were provided to the                            patient.                           - Resume previous diet.                           - Continue present medications.                           - Await pathology results.                           -  Repeat colonoscopy for surveillance based on                            pathology results.                           - Use Canasa 1000 mg suppository 1 per rectum QHS                            while awaiting pathology results and follow-up.                           - Return to GI clinic at appointment to be                            scheduled. Doristine Locks, MD 03/27/2023 4:38:01 PM

## 2023-03-27 NOTE — Progress Notes (Signed)
GASTROENTEROLOGY PROCEDURE H&P NOTE   Primary Care Physician: Charlane Ferretti, DO    Reason for Procedure:   Hematochezia, chronic diarrhea, Fhx of colon polyps  Plan:    Colonoscopy  Patient is appropriate for endoscopic procedure(s) in the ambulatory ([facility]) setting.  The nature of the procedure, as well as the risks, benefits, and alternatives were carefully and thoroughly reviewed with the patient. Ample time for discussion and questions allowed. The patient understood, was satisfied, and agreed to proceed.     HPI: Sean Ray is a 52 y.o. male who presents for colonoscopy for evaluation of hematochezia .  Patient was most recently seen in the Gastroenterology Clinic on 03/23/2023.  No interval change in medical history since that appointment. Please refer to that note for full details regarding GI history and clinical presentation.   Colonoscopy 08/06/2019 by Dr. Christella Hartigan due to having chronic diarrhea: - The examined portion of the ileum was normal.  - The entire examined colon is normal on direct and retroflexion views. - Biopsies were taken with a cold forceps from the entire colon for evaluation of microscopic colitis - Recall colonoscopy 10 years  - BENIGN COLONIC MUCOSA. - NO ACTIVE INFLAMMATION OR EVIDENCE OF MICROSCOPIC COLITIS. - NO DYSPLASIA OR MALIGNANCY.   Colonoscopy 11/25/2008: Mild erythema in colon Normal terminal ileum Otherwise normal examination Very mild erythema in majority of colon.  Normal terminal ileum.   Biopsies taken to check for chronic colitis. COLON, RANDOM BIOPSIES: BENIGN COLONIC MUCOSA. NO ACTIVE   INFLAMMATION, MICROSCOPIC COLITIS OR GRANULOMAS.   Past Medical History:  Diagnosis Date   Anxiety    Bipolar disorder (HCC)    Blood transfusion without reported diagnosis    at age 76    Chronic kidney disease    Acute Kidney injury per pt. Kidney back to normal per pt   Depression    Hereditary and idiopathic peripheral neuropathy  11/28/2016   Hypertension     Past Surgical History:  Procedure Laterality Date   COLONOSCOPY  11/25/2008   CYST EXCISION  at age 60   after T&A   TONSILLECTOMY AND ADENOIDECTOMY  1984    Prior to Admission medications   Medication Sig Start Date End Date Taking? Authorizing Provider  lamoTRIgine (LAMICTAL) 200 MG tablet Take 200 mg by mouth daily.     Yes [provider]  lisinopril (PRINIVIL,ZESTRIL) 10 MG tablet Take 1 tablet (10 mg total) by mouth daily. 08/22/11  Yes Swords, Valetta Mole, MD  Multiple Vitamin (MULTIVITAMIN) tablet Take 1 tablet by mouth daily.   Yes [provider]  vitamin C (ASCORBIC ACID) 500 MG tablet Take 500 mg by mouth daily.   Yes [provider]  hydrocortisone (ANUSOL-HC) 25 MG suppository Place 1 suppository (25 mg total) rectally at bedtime. 03/23/23   Arnaldo Natal, NP  risperiDONE (RISPERDAL) 1 MG tablet Take by mouth. Patient not taking: Reported on 03/23/2023    [provider]    Current Outpatient Medications  Medication Sig Dispense Refill   lamoTRIgine (LAMICTAL) 200 MG tablet Take 200 mg by mouth daily.       lisinopril (PRINIVIL,ZESTRIL) 10 MG tablet Take 1 tablet (10 mg total) by mouth daily. 90 tablet 1   Multiple Vitamin (MULTIVITAMIN) tablet Take 1 tablet by mouth daily.     vitamin C (ASCORBIC ACID) 500 MG tablet Take 500 mg by mouth daily.     hydrocortisone (ANUSOL-HC) 25 MG suppository Place 1 suppository (25 mg total) rectally at  bedtime. 5 suppository 0   risperiDONE (RISPERDAL) 1 MG tablet Take by mouth. (Patient not taking: Reported on 03/23/2023)     Current Facility-Administered Medications  Medication Dose Route Frequency Provider Last Rate Last Admin   0.9 %  sodium chloride infusion  500 mL Intravenous Once Marvin Grabill V, DO        Allergies as of 03/27/2023 - Review Complete 03/27/2023  Allergen Reaction Noted   Penicillins Swelling     Family History  Problem Relation Age  of Onset   Cancer Mother        breast   Hypertension Mother    Arthritis Mother    Parkinson's disease Father    Neuropathy Father    Colon polyps Father    Colon cancer Neg Hx    Esophageal cancer Neg Hx    Rectal cancer Neg Hx    Stomach cancer Neg Hx     Social History   Socioeconomic History   Marital status: Married    Spouse name: Not on file   Number of children: 1   Years of education: PhD   Highest education level: Not on file  Occupational History   Occupation: [employer]  Tobacco Use   Smoking status: Never   Smokeless tobacco: Never  Vaping Use   Vaping Use: Never used  Substance and Sexual Activity   Alcohol use: Not Currently    Comment: hx of alcohol abuse, quit 2012   Drug use: No   Sexual activity: Not on file    Comment: Married  Other Topics Concern   Not on file  Social History Narrative   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   Married   Drinks 4-6 caffeinated beverages daily   Social Determinants of Corporate investment banker Strain: Not on file  Food Insecurity: Not on file  Transportation Needs: Not on file  Physical Activity: Not on file  Stress: Not on file  Social Connections: Not on file  Intimate Partner Violence: Not on file    Physical Exam: Vital signs in last 24 hours: @BP  (!) 147/78   Pulse (!) 59   Temp 98.2 F (36.8 C) (Temporal)   Ht 6\' 6"  (1.981 m)   Wt 220 lb (99.8 kg)   SpO2 98%   BMI 25.42 kg/m  GEN: NAD EYE: Sclerae anicteric ENT: MMM CV: Non-tachycardic Pulm: CTA b/l GI: Soft, NT/ND NEURO:  Alert & Oriented x 3   Doristine Locks, DO [facility]   03/27/2023 3:53 PM

## 2023-03-27 NOTE — Progress Notes (Signed)
Report given to PACU, vss 

## 2023-03-27 NOTE — Patient Instructions (Signed)
Impression/Recommendations:  Polyp and diverticulosis handouts given to patient.  Resume previous diet. Continue present medications. Await pathology results.  Use Canasa 1000 mg suppository per rectum every evening while awaiting pathology results and follow-up.  Return to GI clinic at appointment to be scheduled.  YOU HAD AN ENDOSCOPIC PROCEDURE TODAY AT THE [facility]:   Refer to the procedure report that was given to you for any specific questions about what was found during the examination.  If the procedure report does not answer your questions, please call your gastroenterologist to clarify.  If you requested that your care partner not be given the details of your procedure findings, then the procedure report has been included in a sealed envelope for you to review at your convenience later.  YOU SHOULD EXPECT: Some feelings of bloating in the abdomen. Passage of more gas than usual.  Walking can help get rid of the air that was put into your GI tract during the procedure and reduce the bloating. If you had a lower endoscopy (such as a colonoscopy or flexible sigmoidoscopy) you may notice spotting of blood in your stool or on the toilet paper. If you underwent a bowel prep for your procedure, you may not have a normal bowel movement for a few days.  Please Note:  You might notice some irritation and congestion in your nose or some drainage.  This is from the oxygen used during your procedure.  There is no need for concern and it should clear up in a day or so.  SYMPTOMS TO REPORT IMMEDIATELY:  Following lower endoscopy (colonoscopy or flexible sigmoidoscopy):  Excessive amounts of blood in the stool  Significant tenderness or worsening of abdominal pains  Swelling of the abdomen that is new, acute  Fever of 100F or higher  For urgent or emergent issues, a gastroenterologist can be reached at any hour by calling [phone]. Do not use MyChart messaging for urgent  concerns.    DIET:  We do recommend a small meal at first, but then you may proceed to your regular diet.  Drink plenty of fluids but you should avoid alcoholic beverages for 24 hours.  ACTIVITY:  You should plan to take it easy for the rest of today and you should NOT DRIVE or use heavy machinery until tomorrow (because of the sedation medicines used during the test).    FOLLOW UP: Our staff will call the number listed on your records the next business day following your procedure.  We will call around 7:15- 8:00 am to check on you and address any questions or concerns that you may have regarding the information given to you following your procedure. If we do not reach you, we will leave a message.     If any biopsies were taken you will be contacted by phone or by letter within the next 1-3 weeks.  Please call us at [phone] if you have not heard about the biopsies in 3 weeks.    SIGNATURES/CONFIDENTIALITY: You and/or your care partner have signed paperwork which will be entered into your electronic medical record.  These signatures attest to the fact that that the information above on your After Visit Summary has been reviewed and is understood.  Full responsibility of the confidentiality of this discharge information lies with you and/or your care-partner.

## 2023-03-27 NOTE — Progress Notes (Signed)
   Chief Complaint  Patient presents with   Callouses    Patient came in today for bilateral callus, and left foot plantar wart, patient denies any pain,     HPI: 52 y.o. male presenting today for evaluation of symptomatic skin lesions noted to the bilateral feet.  Most concerning is a new lesion that developed a few months ago to the medial aspect of the right great toe.  Associated tenderness with palpation.  He does have a history of plantar warts.  He has not done anything recently for treatment  Past Medical History:  Diagnosis Date   Anxiety    Bipolar disorder (HCC)    Blood transfusion without reported diagnosis    at age 50    Chronic kidney disease    Acute Kidney injury per pt. Kidney back to normal per pt   Depression    Hereditary and idiopathic peripheral neuropathy 11/28/2016   Hypertension     Past Surgical History:  Procedure Laterality Date   COLONOSCOPY  11/25/2008   CYST EXCISION  at age 12   after T&A   TONSILLECTOMY AND ADENOIDECTOMY  1984    Allergies  Allergen Reactions   Penicillins Swelling    REACTION: as child, swelling     Physical Exam: General: The patient is alert and oriented x3 in no acute distress.  Dermatology: Hyperkeratotic skin lesions noted to the weightbearing surfaces of the bilateral feet and distal tips of the second digits bilateral as well.  There is also a symptomatic skin lesion with a central nucleated core to the medial aspect of the right great toe around the IPJ  Vascular: Palpable pedal pulses bilaterally. Capillary refill within normal limits.  No appreciable edema.  No erythema.  Neurological: Grossly intact via light touch  Musculoskeletal Exam: Hammertoe contracture deformity noted to the second digit bilateral contributing to the increased pressure on the distal tips of the toes  Assessment/Plan of Care: 1.  Symptomatic benign skin lesions bilateral  -Patient evaluated.  Excisional debridement of the  hyperkeratotic callus tissue was performed today using a 312 scalpel without incident or bleeding.  Patient tolerated well -Salicylic acid applied.  Recommend OTC salicylic acid daily -Advised against going barefoot.  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 Felecia Shelling, DPM    [address] [place], [place] [address]                Office [phone]  Fax [phone]

## 2023-03-27 NOTE — Progress Notes (Signed)
VS completed by EC.   Pt's states no medical or surgical changes since previsit or office visit.  

## 2023-03-27 NOTE — Progress Notes (Signed)
Called to room to assist during endoscopic procedure.  Patient ID and intended procedure confirmed with present staff. Received instructions for my participation in the procedure from the performing physician.  

## 2023-03-28 ENCOUNTER — Telehealth: Payer: Self-pay

## 2023-03-28 NOTE — Telephone Encounter (Signed)
  Follow up Call-     03/27/2023    3:09 PM  Call back number  Post procedure Call Back phone  # [phone]  Permission to leave phone message Yes     Patient questions:  Do you have a fever, pain , or abdominal swelling? No. Pain Score  0 *  Have you tolerated food without any problems? Yes.    Have you been able to return to your normal activities? Yes.    Do you have any questions about your discharge instructions: Diet   No. Medications  No. Follow up visit  No.  Do you have questions or concerns about your Care? No.  Actions: * If pain score is 4 or above: No action needed, pain <4.

## 2023-03-30 ENCOUNTER — Encounter: Payer: BC Managed Care – PPO | Admitting: Gastroenterology

## 2023-06-06 ENCOUNTER — Ambulatory Visit: Payer: BC Managed Care – PPO | Admitting: Gastroenterology

## 2023-06-06 ENCOUNTER — Encounter: Payer: Self-pay | Admitting: Gastroenterology

## 2023-06-06 VITALS — BP 128/76 | HR 79 | Ht 78.0 in | Wt 214.1 lb

## 2023-06-06 DIAGNOSIS — K50119 Crohn's disease of large intestine with unspecified complications: Secondary | ICD-10-CM

## 2023-06-06 DIAGNOSIS — K5730 Diverticulosis of large intestine without perforation or abscess without bleeding: Secondary | ICD-10-CM

## 2023-06-06 DIAGNOSIS — K580 Irritable bowel syndrome with diarrhea: Secondary | ICD-10-CM

## 2023-06-06 MED ORDER — MESALAMINE 1000 MG RE SUPP: 1000.0000 mg | Freq: Every day | RECTAL | 3 refills | Status: DC

## 2023-06-06 NOTE — Patient Instructions (Addendum)
We have sent the following medications to your pharmacy for you to pick up at your convenience: Canasa  Please follow up in 6 to 12 months. Give [place] a call at [phone] to schedule an appointment.   Fiber Content in Foods:   Fiber is found in plant foods, such as fruits, vegetables, whole grains, nuts, seeds, and beans. If you have certain conditions, you may need to eat a high-fiber diet or a low-fiber diet. Your health care provider will tell you how much fiber you need. If you have problems or questions, contact your provider or dietitian. What foods are high in fiber?  Foods high in fiber have 4g of fiber or more per serving. Fruits Blackberries or raspberries (fresh) --  cup (75 g) has 4 g of fiber. Pear (fresh) -- 1 medium (180 g) has 5.5 g of fiber. Prunes (dried) -- 6 to 8 pieces (57-76 g) has 5 g of fiber. Apple with skin -- 1 medium (182 g) has 4.8 g of fiber. Guava -- 1 cup (128 g) has 8.9 g of fiber. Vegetables Peas (frozen) --  cup (80 g) has 4.4 g of fiber. Potato with skin (baked) -- 1 medium (173 g) has 4 g of fiber. Pumpkin (canned) --  cup (122 g) has 4 g of fiber. Sweet potato --  cup mashed (124 g) has 4 g of fiber. Winter squash -- 1 cup cooked (205 g) has 5.7 g of fiber. Grains Bran cereal --  cup (31 g) has 8.6 g of fiber. Bulgur (cooked) --  cup (70 g) has 4 g of fiber. Quinoa (cooked) -- 1 cup (185 g) has 5.2 g of fiber. Popcorn -- 3 cups (375 g) popped has 5.8 g of fiber. Spaghetti, whole wheat -- 1 cup (140 g) has 6 g of fiber. Oatmeal (cooked) -- 1 cup (234 g) has 4g of fiber. Meats and other proteins Pinto beans (cooked) --  cup (90 g) has 7.7 g of fiber. Lentils (cooked) --  cup (90 g) has 7.8 g of fiber. Kidney beans (canned) --  cup (92.5 g) has 5.7 g of fiber. Soybeans (canned, frozen, or fresh) --  cup (92.5 g) has 5.2 g of fiber. Baked beans, plain or vegetarian (canned) --  cup (130 g) has 5.2 g of fiber. Garbanzo beans or chickpeas  (canned) --  cup (90 g) has 6.6 g of fiber. Black beans (cooked) --  cup (86 g) has 7.5 g of fiber. White beans or navy beans (cooked) --  cup (91 g) has 9.3 g of fiber. The items listed above may not be a complete list of foods with high fiber. Actual amounts of fiber may be different depending on processing. Contact a dietitian for more information. What foods are moderate in fiber?  Moderate fiber foods have 1-3 g of fiber per serving. Fruits Banana -- 1 medium (126 g) has 3.2 g of fiber. Melon -- 1 cup (155 g) has 1.4 g of fiber. Orange -- 1 small (154 g) has 3.7 g of fiber. Raisins --  cup (40 g) has 1.8 g of fiber. Applesauce, sweetened --  cup (125 g) has 1.5 g of fiber. Blueberries (fresh) --  cup (75 g) has 1.8 g of fiber. Strawberries (fresh, sliced) -- 1 cup (150 g) has 3 g of fiber. Cherries -- 1 cup (140 g) has 2.9 g of fiber. Vegetables Broccoli (cooked) --  cup (77.5 g) has 2.1 g of fiber. Brussels sprouts (cooked) --  cup (78 g) has 3 g of fiber. Carrots (cooked) --  cup (77.5 g) has 2.2 g of fiber. Corn (canned or frozen) --  cup (82.5 g) has 2.1 g of fiber. Potatoes, mashed --  cup (105 g) has 1.6 g of fiber. Tomato -- 1 medium (62 g) has 1.5 g of fiber. Green beans (canned) --  cup (83 g) has 2 g of fiber. Sweet potato, baked -- 1 medium (150 g) has 3 g of fiber. Cauliflower (cooked) -- 1/2 cup (90 g) has 2.3 g of fiber. Grains Long-grain brown rice (cooked) -- 1 cup (196 g) has 3.5 g of fiber. Bagel, plain -- one 4-inch (10 cm) bagel has 2 g of fiber. Instant oatmeal --  cup (120 g) has about 2 g of fiber. Macaroni noodles, enriched (cooked) -- 1 cup (140 g) has 2.5 g of fiber. Multigrain cereal --  cup (15 g) has about 2-4 g of fiber. Whole-wheat bread -- 1 slice (26 g) has 2 g of fiber. Whole-wheat spaghetti noodles --  cup (70 g) has 3.2 g of fiber. Corn tortilla -- one 6-inch (15 cm) tortilla has 1.5 g of fiber. Meats and other  proteins Almonds --  cup or 1 oz (28 g) has 3.5 g of fiber. Sunflower seeds in shell --  cup or  oz (11.5 g) has 1.1 g of fiber. Vegetable or soy patty -- 1 patty (70 g) has 3.4 g of fiber. Walnuts --  cup or 1 oz (30 g) has 2 g of fiber. Flax seed -- 1 Tbsp (7 g) has 2.8 g of fiber. The items listed above may not be a complete list of foods with moderate amounts of fiber. Actual amounts of fiber may be different depending on processing. Contact a dietitian for more information. What foods are low in fiber?  Low-fiber foods contain less than 1 g of fiber per serving. They include: Fruits Fruit juice --  cup or 4 fl oz (118 mL) has 0.5 g of fiber. Vegetables Lettuce -- 1 cup (35 g) has 0.5 g of fiber. Cucumber (slices) --  cup (60 g) has 0.3 g of fiber. Celery -- 1 stalk (40 g) has 0.1 g of fiber. Grains Flour tortilla -- one 6-inch (15 cm) tortilla has 0.5 g of fiber. White rice (cooked) --  cup (81.5 g) has 0.3 g of fiber. Meats and other proteins Egg -- 1 large (50 g) has 0 g of fiber. Meat, poultry, or fish -- 3 oz (85 g) has 0 g of fiber. Dairy Milk -- 1 cup or 8 fl oz (237 mL) has 0 g of fiber. Yogurt -- 1 cup (245 g) has 0 g of fiber. The items listed above may not be a complete list of foods that are low in fiber. Actual amounts of fiber may be different depending on processing. Contact a dietitian for more information. This information is not intended to replace advice given to you by your health care provider. Make sure you discuss any questions you have with your health care provider. Document Revised: 01/23/2023 Document Reviewed: 01/23/2023 Elsevier Patient Education  2024 Elsevier Inc.  Low-FODMAP Eating Plan:  FODMAP stands for fermentable oligosaccharides, disaccharides, monosaccharides, and polyols. These are sugars that are hard for some people to digest. A low-FODMAP eating plan may help some people who have irritable bowel syndrome (IBS) and certain other  bowel (intestinal) diseases to manage their symptoms. This meal plan can be complicated to follow. Work with  a diet and nutrition specialist (dietitian) to make a low-FODMAP eating plan that is right for you. A dietitian can help make sure that you get enough nutrition from this diet. What are tips for following this plan? Reading food labels Check labels for hidden FODMAPs such as: High-fructose syrup. Honey. Agave. Natural fruit flavors. Onion or garlic powder. Choose low-FODMAP foods that contain 3-4 grams of fiber per serving. Check food labels for serving sizes. Eat only one serving at a time to make sure FODMAP levels stay low. Sho[employer] with a list of foods that are recommended on this diet and make a meal plan. Meal planning Follow a low-FODMAP eating plan for up to 6 weeks, or as told by your health care provider or dietitian. To follow the eating plan: Eliminate high-FODMAP foods from your diet completely. Choose only low-FODMAP foods to eat. You will do this for 2-6 weeks. Gradually reintroduce high-FODMAP foods into your diet one at a time. Most people should wait a few days before introducing the next new high-FODMAP food into their meal plan. Your dietitian can recommend how quickly you may reintroduce foods. Keep a daily record of what and how much you eat and drink. Make note of any symptoms that you have after eating. Review your daily record with a dietitian regularly to identify which foods you can eat and which foods you should avoid. General tips Drink enough fluid each day to keep your urine pale yellow. Avoid processed foods. These often have added sugar and may be high in FODMAPs. Avoid most dairy products, whole grains, and sweeteners. Work with a dietitian to make sure you get enough fiber in your diet. Avoid high FODMAP foods at meals to manage symptoms. Recommended foods Fruits Bananas, oranges, tangerines, lemons, limes, blueberries, raspberries,  strawberries, grapes, cantaloupe, honeydew melon, kiwi, papaya, passion fruit, and pineapple. Limited amounts of dried cranberries, banana chips, and shredded coconut. Vegetables Eggplant, zucchini, cucumber, peppers, green beans, bean sprouts, lettuce, arugula, kale, Swiss chard, spinach, collard greens, bok choy, summer squash, potato, and tomato. Limited amounts of corn, carrot, and sweet potato. Green parts of scallions. Grains Gluten-free grains, such as rice, oats, buckwheat, quinoa, corn, polenta, and millet. Gluten-free pasta, bread, or cereal. Rice noodles. Corn tortillas. Meats and other proteins Unseasoned beef, pork, poultry, or fish. Eggs. Tomasa Blase. Tofu (firm) and tempeh. Limited amounts of nuts and seeds, such as almonds, walnuts, [place] nuts, pecans, peanuts, nut butters, pumpkin seeds, chia seeds, and sunflower seeds. Dairy Lactose-free milk, yogurt, and kefir. Lactose-free cottage cheese and ice cream. Non-dairy milks, such as almond, coconut, hemp, and rice milk. Non-dairy yogurt. Limited amounts of goat cheese, brie, mozzarella, parmesan, swiss, and other hard cheeses. Fats and oils Butter-free spreads. Vegetable oils, such as olive, canola, and sunflower oil. Seasoning and other foods Artificial sweeteners with names that do not end in "ol," such as aspartame, saccharine, and stevia. Maple syrup, white table sugar, raw sugar, brown sugar, and molasses. Mayonnaise, soy sauce, and tamari. Fresh basil, coriander, parsley, rosemary, and thyme. Beverages Water and mineral water. Sugar-sweetened soft drinks. Small amounts of orange juice or cranberry juice. Black and green tea. Most dry wines. Coffee. The items listed above may not be a complete list of foods and beverages you can eat. Contact a dietitian for more information. Foods to avoid Fruits Fresh, dried, and juiced forms of apple, pear, watermelon, peach, plum, cherries, apricots, blackberries, boysenberries, figs, nectarines,  and mango. Avocado. Vegetables Chicory root, artichoke, asparagus, cabbage, snow peas, [address]  sprouts, broccoli, sugar snap peas, mushrooms, celery, and cauliflower. Onions, garlic, leeks, and the white part of scallions. Grains Wheat, including kamut, durum, and semolina. Barley and bulgur. Couscous. Wheat-based cereals. Wheat noodles, bread, crackers, and pastries. Meats and other proteins Fried or fatty meat. Sausage. Cashews and pistachios. Soybeans, baked beans, black beans, chickpeas, kidney beans, fava beans, navy beans, lentils, black-eyed peas, and split peas. Dairy Milk, yogurt, ice cream, and soft cheese. Cream and sour cream. Milk-based sauces. Custard. Buttermilk. Soy milk. Seasoning and other foods Any sugar-free gum or candy. Foods that contain artificial sweeteners such as sorbitol, mannitol, isomalt, or xylitol. Foods that contain honey, high-fructose corn syrup, or agave. Bouillon, vegetable stock, beef stock, and chicken stock. Garlic and onion powder. Condiments made with onion, such as hummus, chutney, pickles, relish, salad dressing, and salsa. Tomato paste. Beverages Chicory-based drinks. Coffee substitutes. Chamomile tea. Fennel tea. Sweet or fortified wines such as port or sherry. Diet soft drinks made with isomalt, mannitol, maltitol, sorbitol, or xylitol. Apple, pear, and mango juice. Juices with high-fructose corn syrup. The items listed above may not be a complete list of foods and beverages you should avoid. Contact a dietitian for more information. Summary FODMAP stands for fermentable oligosaccharides, disaccharides, monosaccharides, and polyols. These are sugars that are hard for some people to digest. A low-FODMAP eating plan is a short-term diet that helps to ease symptoms of certain bowel diseases. The eating plan usually lasts up to 6 weeks. After that, high-FODMAP foods are reintroduced gradually and one at a time. This can help you find out which foods may be  causing symptoms. A low-FODMAP eating plan can be complicated. It is best to work with a dietitian who has experience with this type of plan. This information is not intended to replace advice given to you by your health care provider. Make sure you discuss any questions you have with your health care provider. Document Revised: 03/19/2020 Document Reviewed: 03/19/2020 Elsevier Patient Education  2024 Elsevier Inc.   _______________________________________________________  If your blood pressure at your visit was 140/90 or greater, please contact your primary care physician to follow up on this.  _______________________________________________________ If you are age 32 or younger, your body mass index should be between 19-25. Your Body mass index is 24.74 kg/m. If this is out of the aformentioned range listed, please consider follow up with your Primary Care Provider.   __________________________________________________________  The [facility] providers would like to encourage you to use [facility] to communicate with providers for non-urgent requests or questions.  Due to long hold times on the telephone, sending your provider a message by [facility] may be a faster and more efficient way to get a response.  Please allow 48 business hours for a response.  Please remember that this is for non-urgent requests.   Due to recent changes in healthcare laws, you may see the results of your imaging and laboratory studies on MyChart before your provider has had a chance to review them.  We understand that in some cases there may be results that are confusing or concerning to you. Not all laboratory results come back in the same time frame and the provider may be waiting for multiple results in order to interpret others.  Please give [place] 48 hours in order for your provider to thoroughly review all the results before contacting the office for clarification of your results.     Thank you for choosing me and  [facility].  Vito Cirigliano, D.O.

## 2023-06-06 NOTE — Progress Notes (Unsigned)
Chief Complaint:    Rectal bleeding, hemorrhoids   HPI:     Patient is a 52 y.o. male with a history of anxiety, depression, bipolar, peripheral neuropathy and hypertension, presenting to the [facility] for follow-up.  Was initially seen by Alcide Evener on 03/23/2023 for evaluation of episodic, painless BRBPR.  Exam notable for internal hemorrhoids.  Has chronic loose stools with previous unremarkable colonoscopy in 11/2008 and 07/2019.  Negative celiac serology in 2009.  Typically passes 2 loose bowel movements daily.  Was started on Benefiber daily and Imodium prn.  Treated hemorrhoids with topical therapy.  - 03/27/2023: Colonoscopy: 2 mm transverse colon hyperplastic polyp, 4 mm sigmoid polyp (resected, but disintegrated upon retrieved.  No pathology).  Localized inflammation in sigmoid located 25-30 cm from anal verge and an area of dense diverticulosis (path: Active colitis with erosion without chronic inflammatory changes).  Localized mild inflammation with aphthous ulcers in the distal rectum (path: Active colitis with erosion without chronic inflammatory changes or granuloma).  Left-sided diverticulosis, small transverse colon lipoma.  Remainder of the colonic mucosa was normal-appearing with benign biopsies.  Normal TI.  Small internal hemorrhoids.  Started on Canasa suppositories  He states his rectal bleeding has stopped.     Endoscopic History: - 11/2008: Colonoscopy: Normal.  Biopsies negative for [facility], IBD - 07/2019: Colonoscopy: Normal.  Biopsies negative for [facility], IBD.  Repeat in 10 years - 03/27/2023: Colonoscopy: 2 mm transverse colon hyperplastic polyp, 4 mm sigmoid polyp (resected, but disintegrated upon retrieved.  No pathology).  Localized inflammation in sigmoid located 25-30 cm from anal verge and an area of dense diverticulosis (path: Active colitis with erosion without chronic inflammatory changes).  Localized mild inflammation with aphthous ulcers in the  distal rectum (path: Active colitis with erosion without chronic inflammatory changes or granuloma).  Left-sided diverticulosis, small transverse colon lipoma.  Remainder of the colonic mucosa was normal-appearing with benign biopsies.  Normal TI.  Small internal hemorrhoids.  Started on Canasa suppositories  Family history notable for father with colon polyps.  No family history of IBD or CRC.    Review of systems:     No chest pain, no SOB, no fevers, no urinary sx   Past Medical History:  Diagnosis Date   Anxiety    Bipolar disorder (HCC)    Blood transfusion without reported diagnosis    at age 60    Chronic kidney disease    Acute Kidney injury per pt. 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       lisinopril (PRINIVIL,ZESTRIL) 10 MG tablet Take 1 tablet (10 mg total) by mouth daily. 90 tablet 1   mesalamine (CANASA) 1000 MG suppository Place 1 suppository (1,000 mg total) rectally at bedtime. 30 suppository 2   Multiple Vitamin (MULTIVITAMIN) tablet Take 1 tablet by mouth daily.     vitamin C (ASCORBIC ACID) 500 MG tablet Take 500 mg by mouth daily.     hydrocortisone (ANUSOL-HC) 25 MG suppository Place 1 suppository (25 mg total) rectally at bedtime. (Patient not taking: Reported on 06/06/2023) 5 suppository 0   risperiDONE (RISPERDAL) 1 MG tablet Take by mouth. (Patient not  taking: Reported on 03/23/2023)     No current facility-administered medications for this visit.    Physical Exam:     BP 128/76 (BP Location: Left Arm, Patient Position: Sitting, Cuff Size: Normal)   Pulse 79   Ht 6\' 6"  (1.981 m)   Wt 214 lb 2 oz (97.1 kg)   SpO2 98%    BMI 24.74 kg/m   GENERAL:  Pleasant *** in NAD PSYCH: : Cooperative, normal affect EENT:  conjunctiva pink, mucous membranes moist, neck supple without masses CARDIAC:  RRR, ***murmur heard, no peripheral edema PULM: Normal respiratory effort, lungs CTA bilaterally, no wheezing ABDOMEN:  Nondistended, soft, nontender. No obvious masses, no hepatomegaly,  normal bowel sounds SKIN:  turgor, no lesions seen Musculoskeletal:  Normal muscle tone, normal strength NEURO: Alert and oriented x 3, no focal neurologic deficits   IMPRESSION and PLAN:    1)   Low FODMAP diet Canasa refill today ESR/CRP in 6-12 months Fiber supplement  Crohns vs segmental vs IBS-D wit acute insult. May be able to stop canasa, but he prefers to resume for now            Shellia Cleverly ,DO, [facility] 06/06/2023, 11:45 AM

## 2023-07-05 ENCOUNTER — Encounter: Payer: Self-pay | Admitting: Gastroenterology

## 2023-07-05 DIAGNOSIS — K580 Irritable bowel syndrome with diarrhea: Secondary | ICD-10-CM

## 2023-07-05 DIAGNOSIS — K50119 Crohn's disease of large intestine with unspecified complications: Secondary | ICD-10-CM

## 2023-07-05 DIAGNOSIS — K625 Hemorrhage of anus and rectum: Secondary | ICD-10-CM

## 2023-07-06 NOTE — Telephone Encounter (Signed)
Patient with hx of active colitis (sigmoid 25-30 from anal verge) and localized mild inflammation with aphthous ulcers in distal rectum on 03/27/23 colonoscopy c/o 1 week more frequent bowel movements (4+ x daily vs normal 2 x daily) as well as mucus and blood in toilet. Patient states that he has had loose/soft stools for years and notices no difference in that. He denies any abdominal pain, rectal pain. States he has been using Canasa suppositories nightly as directed. +low fodmap.   Please advise with recommendations if any.Marland KitchenMarland Kitchen

## 2023-07-07 ENCOUNTER — Other Ambulatory Visit (INDEPENDENT_AMBULATORY_CARE_PROVIDER_SITE_OTHER): Payer: BC Managed Care – PPO

## 2023-07-07 DIAGNOSIS — K580 Irritable bowel syndrome with diarrhea: Secondary | ICD-10-CM

## 2023-07-07 DIAGNOSIS — K50119 Crohn's disease of large intestine with unspecified complications: Secondary | ICD-10-CM

## 2023-07-07 DIAGNOSIS — K625 Hemorrhage of anus and rectum: Secondary | ICD-10-CM

## 2023-07-07 LAB — SEDIMENTATION RATE: Sed Rate: 3 mm/hr (ref 0–20)

## 2023-07-07 LAB — HIGH SENSITIVITY CRP: CRP, High Sensitivity: 2.75 mg/L (ref 0.000–5.000)

## 2023-07-07 MED ORDER — MESALAMINE 1.2 G PO TBEC: DELAYED_RELEASE_TABLET | ORAL | 1 refills | Status: DC

## 2023-07-07 NOTE — Telephone Encounter (Signed)
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 Advised that we should give him a trial of Lialda x 8 weeks, then decrease dosage thereafter. Advised to continue Canasa for now as well. Reiterated to patient that he should be on a fiber supplementation as well. Lab orders placed in EPIC and patient advised to come for these to evaluate for active inflammation. Patient verbalizes understanding of this information.

## 2023-07-10 ENCOUNTER — Other Ambulatory Visit: Payer: BC Managed Care – PPO

## 2023-07-10 DIAGNOSIS — K580 Irritable bowel syndrome with diarrhea: Secondary | ICD-10-CM

## 2023-07-10 DIAGNOSIS — K50119 Crohn's disease of large intestine with unspecified complications: Secondary | ICD-10-CM

## 2023-07-10 DIAGNOSIS — K625 Hemorrhage of anus and rectum: Secondary | ICD-10-CM

## 2023-07-12 LAB — CALPROTECTIN, FECAL: Calprotectin, Fecal: 4960 ug/g — ABNORMAL HIGH (ref 0–120)

## 2023-07-20 ENCOUNTER — Encounter: Payer: Self-pay | Admitting: Gastroenterology

## 2023-07-20 ENCOUNTER — Ambulatory Visit: Payer: BC Managed Care – PPO | Admitting: Gastroenterology

## 2023-07-20 ENCOUNTER — Other Ambulatory Visit: Payer: BC Managed Care – PPO

## 2023-07-20 VITALS — BP 128/80 | HR 76 | Ht 78.0 in | Wt 219.2 lb

## 2023-07-20 DIAGNOSIS — K523 Indeterminate colitis: Secondary | ICD-10-CM | POA: Diagnosis not present

## 2023-07-20 DIAGNOSIS — K5730 Diverticulosis of large intestine without perforation or abscess without bleeding: Secondary | ICD-10-CM

## 2023-07-20 DIAGNOSIS — K5010 Crohn's disease of large intestine without complications: Secondary | ICD-10-CM | POA: Diagnosis not present

## 2023-07-20 DIAGNOSIS — R197 Diarrhea, unspecified: Secondary | ICD-10-CM | POA: Diagnosis not present

## 2023-07-20 DIAGNOSIS — K50119 Crohn's disease of large intestine with unspecified complications: Secondary | ICD-10-CM

## 2023-07-20 DIAGNOSIS — K921 Melena: Secondary | ICD-10-CM | POA: Diagnosis not present

## 2023-07-20 MED ORDER — NA SULFATE-K SULFATE-MG SULF 17.5-3.13-1.6 GM/177ML PO SOLN: 1.0000 | Freq: Once | ORAL | 0 refills | Status: AC

## 2023-07-20 NOTE — Progress Notes (Signed)
Chief Complaint:    Hematochezia, indeterminate colitis  GI History: 52 year old male with a history of anxiety, depression, bipolar, peripheral neuropathy and hypertension, follows in the GI clinic for the following:  1) Chronic diarrhea.  Longstanding history of chronic, loose, nonbloody stools for many years.  Typically passes 2 loose stools daily.  Not frank diarrhea. Previous unremarkable colonoscopy in 11/2008 and 07/2019. Negative celiac serology in 2009.  2) Colitis.  Presented to the GI clinic on 03/23/2023 with newer onset episodic, painless BRBPR superimposed on longer standing history of chronic loose stools.  Not much change with Benefiber and Imodium on demand along with topical therapy for hemorrhoids noted on exam. - 03/27/2023: Colonoscopy: 2 mm transverse colon hyperplastic polyp, 4 mm sigmoid polyp. Localized inflammation in sigmoid located 25-30 cm from anal verge and an area of dense diverticulosis (path: Active colitis with erosion without chronic inflammatory changes). Localized mild inflammation with aphthous ulcers in the distal rectum (path: Active colitis with erosion without chronic inflammatory changes or granuloma). Left-sided diverticulosis, small transverse colon lipoma. Remainder of the colonic mucosa was normal-appearing with benign biopsies. Normal TI. Small internal hemorrhoids. Started on Canasa suppositories - 06/06/2023: Follow-up in the GI clinic.  Rectal bleeding has stopped and overall symptom improvement with Canasa suppositories.  Still with 2 looser BM/day, but not changed from baseline.  No nocturnal stools, tenesmus.  Started fiber supplement and low FODMAP diet. - 07/05/2023: Message from patient with increased stool frequency 2/day with episodic BRB.  Started Lialda 4.8 g daily with continued Canasa.  Normal ESR/CRP.  Fecal calprotectin 4960   Endoscopic History: - 11/2008: Colonoscopy: Mildly erythematous colon from rectum to mid ascending (path benign and  without inflammation).  Biopsies negative for [facility], IBD - 07/2019: Colonoscopy: Normal.  Biopsies negative for [facility], IBD.  Repeat in 10 years - 03/27/2023: Colonoscopy: 2 mm transverse colon hyperplastic polyp, 4 mm sigmoid polyp (resected, but disintegrated upon retrieved.  No pathology).  Localized inflammation in sigmoid located 25-30 cm from anal verge and an area of dense diverticulosis (path: Active colitis with erosion without chronic inflammatory changes).  Localized mild inflammation with aphthous ulcers in the distal rectum (path: Active colitis with erosion without chronic inflammatory changes or granuloma).  Left-sided diverticulosis, small transverse colon lipoma.  Remainder of the colonic mucosa was normal-appearing with benign biopsies.  Normal TI.  Small internal hemorrhoids.  Started on Canasa suppositories   Family history notable for father with colon polyps.  No family history of IBD or CRC.  HPI:     Patient is a 52 y.o. male presenting to the Gastroenterology Clinic for evaluation of recent increase in stool frequency with recurrence of episodic BRBPR as outlined above.  Fecal protectant was significantly elevated at 4960 and he was started on Lialda 4.8 gm daily.  Unclear if this is new, mild Crohn's colitis or SCAD all superimposed on a longer standing history of IBS-D.  Started Lialda about 1 week ago.  No appreciable change in symptoms just yet.  Had stopped Canasa for a week or so due to pharmacist comment that this could be worsening his colitis.  Still having blood and mucus like stools.  PCP ordered infectious panel; not yet done.   Presents to the office with his wife who is a Licensed conveyancer.  Review of systems:     No chest pain, no SOB, no fevers, no urinary sx   Past Medical History:  Diagnosis Date   Anxiety    Bipolar disorder (HCC)  Blood transfusion without reported diagnosis    at age 63    Chronic kidney disease    Acute Kidney injury per pt. 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 (Patient not taking: Reported on 06/06/2023) 5 suppository 0   lamoTRIgine (LAMICTAL) 200 MG tablet Take 200 mg by mouth daily.       lisinopril (PRINIVIL,ZESTRIL) 10 MG tablet Take 1 tablet (10 mg total) by mouth daily. 90 tablet 1   mesalamine (CANASA) 1000 MG suppository Place 1 suppository (1,000 mg total) rectally at bedtime. 60 suppository 3   mesalamine (LIALDA) 1.2 g EC tablet Take 2 tablets by mouth twice daily x 8 weeks, then reduce to 2 tablets once daily thereafter 120 tablet 1   Multiple Vitamin (MULTIVITAMIN) tablet Take 1 tablet by mouth daily.     risperiDONE (RISPERDAL) 1 MG tablet Take by mouth. (Patient not taking: Reported on 03/23/2023)     vitamin C (ASCORBIC ACID) 500 MG tablet Take 500 mg by mouth daily.     No current facility-administered medications for this visit.    Physical Exam:     Ht 6\' 6"  (1.981 m)   BMI 24.74 kg/m   GENERAL:  Pleasant male in NAD PSYCH: Cooperative, normal affect Musculoskeletal:  Normal muscle tone, normal strength NEURO: Alert and oriented x 3, no focal neurologic deficits   IMPRESSION and PLAN:    1) Change in bowel habits 2) Hematochezia 3) Indeterminate colitis 4) Diverticulosis 5) Diarrhea Again had a long conversation with the patient and his wife today.  Somewhat perplexing clinical presentation.  He has a longer standing history of chronic loose stools, but typically 2 BM/day. This has been evaluated with previous celiac serologies (negative) and unremarkable colonoscopy in 2010 and 2020.  However, more recently with blood and  mucus like material mixed in the stool.  Underwent colonoscopy in 03/2023 which showed a localized area of inflammation in the sigmoid colon at 25-30 cm from the anal verge, along with inflammation in the distal rectum.  Biopsies from each of these areas showed active colitis without chronic inflammatory changes.  Was treated with Canasa which initially had good clinical response and resolution of hematochezia.  However, symptoms have since returned and fecal calprotectin now significantly elevated at 5000.  He was prescribed Lialda, which he just started last week.  Heightened concern about this really being early IBD, which is why biopsies only showed acute inflammatory changes and not yet developed architectural distortion, granulomata, or other features of chronicity.  We discussed options at length today, to include continued trial of medical management, noninvasive inflammatory markers, or repeat endoscopic evaluation (flexible sigmoidoscopy vs colonoscopy).  He and his wife would very strongly like to proceed with repeat colonoscopy.  Plan for following:  - Expedited colonoscopy next week - He would like to stop the Lialda now to get more of a "baseline" without 5-ASA therapy on board - Check GI PCR panel, C. difficile - Fecal calprotectin trend pending endoscopic findings and medication management       I spent 40 minutes of time, including in depth chart review, independent review of results as  outlined above, communicating results with the patient directly, face-to-face time with the patient, coordinating care, ordering studies and medications as appropriate, and documentation.       Shellia Cleverly ,DO, FACG 07/20/2023, 9:55 AM

## 2023-07-20 NOTE — Patient Instructions (Signed)
Your provider has requested that you go to the basement level for lab work before leaving today. Press "B" on the elevator. The lab is located at the first door on the left as you exit the elevator.   You have been scheduled for a colonoscopy. Please follow written instructions given to you at your visit today.   Please pick up your prep supplies at the pharmacy within the next 1-3 days.  If you use inhalers (even only as needed), please bring them with you on the day of your procedure.  DO NOT TAKE 7 DAYS PRIOR TO TEST- Trulicity (dulaglutide) Ozempic, Wegovy (semaglutide) Mounjaro (tirzepatide) Bydureon Bcise (exanatide extended release)  DO NOT TAKE 1 DAY PRIOR TO YOUR TEST Rybelsus (semaglutide) Adlyxin (lixisenatide) Victoza (liraglutide) Byetta (exanatide) ___________________________________________________________________________  _______________________________________________________  If your blood pressure at your visit was 140/90 or greater, please contact your primary care physician to follow up on this.  _______________________________________________________  If you are age 72 or younger, your body mass index should be between 19-25. Your Body mass index is 25.34 kg/m. If this is out of the aformentioned range listed, please consider follow up with your Primary Care Provider.   __________________________________________________________  The [place] [facility] providers would like to encourage you to use [facility] to communicate with providers for non-urgent requests or questions.  Due to long hold times on the telephone, sending your provider a message by [facility] may be a faster and more efficient way to get a response.  Please allow 48 business hours for a response.  Please remember that this is for non-urgent requests.     Thank you for choosing me and [facility].  Vito Cirigliano, D.O.

## 2023-07-21 ENCOUNTER — Encounter: Payer: Self-pay | Admitting: Gastroenterology

## 2023-07-23 LAB — GI PROFILE, STOOL, PCR

## 2023-07-24 ENCOUNTER — Encounter: Payer: BC Managed Care – PPO | Admitting: Gastroenterology

## 2023-07-26 ENCOUNTER — Ambulatory Visit (AMBULATORY_SURGERY_CENTER): Payer: BC Managed Care – PPO | Admitting: Gastroenterology

## 2023-07-26 ENCOUNTER — Encounter: Payer: Self-pay | Admitting: Gastroenterology

## 2023-07-26 ENCOUNTER — Other Ambulatory Visit: Payer: Self-pay

## 2023-07-26 ENCOUNTER — Other Ambulatory Visit: Payer: BC Managed Care – PPO

## 2023-07-26 VITALS — BP 101/74 | HR 75 | Temp 97.8°F | Resp 16 | Ht 78.0 in | Wt 219.0 lb

## 2023-07-26 DIAGNOSIS — K5010 Crohn's disease of large intestine without complications: Secondary | ICD-10-CM

## 2023-07-26 DIAGNOSIS — R197 Diarrhea, unspecified: Secondary | ICD-10-CM

## 2023-07-26 DIAGNOSIS — K640 First degree hemorrhoids: Secondary | ICD-10-CM

## 2023-07-26 DIAGNOSIS — K50119 Crohn's disease of large intestine with unspecified complications: Secondary | ICD-10-CM

## 2023-07-26 DIAGNOSIS — K5730 Diverticulosis of large intestine without perforation or abscess without bleeding: Secondary | ICD-10-CM

## 2023-07-26 DIAGNOSIS — K635 Polyp of colon: Secondary | ICD-10-CM

## 2023-07-26 DIAGNOSIS — K529 Noninfective gastroenteritis and colitis, unspecified: Secondary | ICD-10-CM | POA: Diagnosis not present

## 2023-07-26 DIAGNOSIS — K6389 Other specified diseases of intestine: Secondary | ICD-10-CM | POA: Diagnosis not present

## 2023-07-26 DIAGNOSIS — K921 Melena: Secondary | ICD-10-CM

## 2023-07-26 DIAGNOSIS — K6289 Other specified diseases of anus and rectum: Secondary | ICD-10-CM | POA: Diagnosis not present

## 2023-07-26 DIAGNOSIS — K523 Indeterminate colitis: Secondary | ICD-10-CM | POA: Diagnosis present

## 2023-07-26 DIAGNOSIS — D122 Benign neoplasm of ascending colon: Secondary | ICD-10-CM

## 2023-07-26 MED ORDER — PREDNISONE 10 MG PO TABS
ORAL_TABLET | ORAL | 0 refills | Status: DC
Start: 2023-07-26 — End: 2023-08-19

## 2023-07-26 MED ORDER — PREDNISONE 10 MG PO TABS
ORAL_TABLET | ORAL | 0 refills | Status: DC
Start: 2023-07-26 — End: 2023-07-26

## 2023-07-26 MED ORDER — SODIUM CHLORIDE 0.9 % IV SOLN: 500.0000 mL | Freq: Once | INTRAVENOUS | Status: DC

## 2023-07-26 NOTE — Patient Instructions (Addendum)
Resume previous diet.  Continue present medications.  Await pathology results.  - Start Prednisone 40 mg PO daily x2 weeks, then                            wean as follow:                           - 30 mg x1 week.                           - 20 mg x1 week.                           - 15 mg x1 week.                           - 10 mg x1 week.                           - 5 mg x1 week then stop.                           - Check Quantiferon gold, HBsAb, HBsAg, HAV Ab,                            TPMT lab.                           - Follow-up with me in the GI clinic in the next                            2-4 weeks. Will discuss options for escalation of                            therapy.                           - Stop Lialda.   YOU HAD AN ENDOSCOPIC PROCEDURE TODAY AT THE [facility]:   Refer to the procedure report that was given to you for any specific questions about what was found during the examination.  If the procedure report does not answer your questions, please call your gastroenterologist to clarify.  If you requested that your care partner not be given the details of your procedure findings, then the procedure report has been included in a sealed envelope for you to review at your convenience later.  YOU SHOULD EXPECT: Some feelings of bloating in the abdomen. Passage of more gas than usual.  Walking can help get rid of the air that was put into your GI tract during the procedure and reduce the bloating. If you had a lower endoscopy (such as a colonoscopy or flexible sigmoidoscopy) you may notice spotting of blood in your stool or on the toilet paper. If you underwent a bowel prep for your procedure, you may not have a normal bowel movement for a few days.  Please Note:  You might notice some irritation and congestion in your nose or some drainage.  This is from the oxygen  used during your procedure.  There is no need for concern and it should clear up in a day or  so.  SYMPTOMS TO REPORT IMMEDIATELY:  Following lower endoscopy (colonoscopy or flexible sigmoidoscopy):  Excessive amounts of blood in the stool  Significant tenderness or worsening of abdominal pains  Swelling of the abdomen that is new, acute  Fever of 100F or higher   For urgent or emergent issues, a gastroenterologist can be reached at any hour by calling [phone]. Do not use MyChart messaging for urgent concerns.    DIET:  We do recommend a small meal at first, but then you may proceed to your regular diet.  Drink plenty of fluids but you should avoid alcoholic beverages for 24 hours.  ACTIVITY:  You should plan to take it easy for the rest of today and you should NOT DRIVE or use heavy machinery until tomorrow (because of the sedation medicines used during the test).    FOLLOW UP: Our staff will call the number listed on your records the next business day following your procedure.  We will call around 7:15- 8:00 am to check on you and address any questions or concerns that you may have regarding the information given to you following your procedure. If we do not reach you, we will leave a message.     If any biopsies were taken you will be contacted by phone or by letter within the next 1-3 weeks.  Please call us at [phone] if you have not heard about the biopsies in 3 weeks.    SIGNATURES/CONFIDENTIALITY: You and/or your care partner have signed paperwork which will be entered into your electronic medical record.  These signatures attest to the fact that that the information above on your After Visit Summary has been reviewed and is understood.  Full responsibility of the confidentiality of this discharge information lies with you and/or your care-partner.

## 2023-07-26 NOTE — Progress Notes (Signed)
Vitals-CW  Pt's states no medical or surgical changes since previsit or office visit. 

## 2023-07-26 NOTE — Progress Notes (Signed)
GASTROENTEROLOGY PROCEDURE H&P NOTE   Primary Care Physician: Charlane Ferretti, DO    Reason for Procedure:   Hematochezia, indeterminate colitis   Plan:    Colonoscopy  Patient is appropriate for endoscopic procedure(s) in the ambulatory ([facility]) setting.  The nature of the procedure, as well as the risks, benefits, and alternatives were carefully and thoroughly reviewed with the patient. Ample time for discussion and questions allowed. The patient understood, was satisfied, and agreed to proceed.     HPI: Sean Ray is a 52 y.o. male who presents for colonsocopy for evaluation of Hematochezia, indeterminate colitis.  Patient was most recently seen in the Gastroenterology Clinic on 07/20/2023 by me.  No interval change in medical history since that appointment. Please refer to that note for full details regarding GI history and clinical presentation.   Past Medical History:  Diagnosis Date   Anxiety    Bipolar disorder (HCC)    Blood transfusion without reported diagnosis    at age 13    Chronic kidney disease    Acute Kidney injury per pt. 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 Authorizing Provider  lamoTRIgine (LAMICTAL) 200 MG tablet Take 200 mg by mouth daily.     Yes [provider]  lisinopril (PRINIVIL,ZESTRIL) 10 MG tablet Take 1 tablet (10 mg total) by mouth daily. 08/22/11  Yes Swords, Valetta Mole, MD  Multiple Vitamin (MULTIVITAMIN) tablet Take 1 tablet by mouth daily.   Yes [provider]  mesalamine (CANASA) 1000 MG suppository Place 1 suppository (1,000 mg total) rectally at bedtime. 06/06/23   Aeson Sawyers V, DO  mesalamine  (LIALDA) 1.2 g EC tablet Take 2 tablets by mouth twice daily x 8 weeks, then reduce to 2 tablets once daily thereafter 07/07/23   Anhad Sheeley V, DO  vitamin C (ASCORBIC ACID) 500 MG tablet Take 500 mg by mouth daily.    [provider]    Current Outpatient Medications  Medication Sig Dispense Refill   lamoTRIgine (LAMICTAL) 200 MG tablet Take 200 mg by mouth daily.       lisinopril (PRINIVIL,ZESTRIL) 10 MG tablet Take 1 tablet (10 mg total) by mouth daily. 90 tablet 1   Multiple Vitamin (MULTIVITAMIN) tablet Take 1 tablet by mouth daily.     mesalamine (CANASA) 1000 MG suppository Place 1 suppository (1,000 mg total) rectally at bedtime. 60 suppository 3   mesalamine (LIALDA) 1.2 g EC tablet Take 2 tablets by mouth twice daily x 8 weeks, then reduce to 2 tablets once daily thereafter 120 tablet 1   vitamin C (ASCORBIC ACID) 500 MG tablet Take 500 mg by mouth daily.     Current Facility-Administered Medications  Medication Dose Route Frequency Provider Last Rate Last Admin   0.9 %  sodium chloride infusion  500 mL Intravenous Once Breaunna Gottlieb V, DO        Allergies as of 07/26/2023 - Review Complete 07/26/2023  Allergen Reaction Noted   Penicillins Swelling     Family History  Problem Relation Age of Onset   Cancer Mother        breast  Hypertension Mother    Arthritis Mother    Parkinson's disease Father    Neuropathy Father    Colon polyps Father    Colon cancer Neg Hx    Esophageal cancer Neg Hx    Rectal cancer Neg Hx    Stomach cancer Neg Hx     Social History   Socioeconomic History   Marital status: Married    Spouse name: Not on file   Number of children: 1   Years of education: PhD   Highest education level: Not on file  Occupational History   Occupation: [employer]  Tobacco Use   Smoking status: Never   Smokeless tobacco: Never  Vaping Use   Vaping status: Never Used  Substance and Sexual Activity   Alcohol use: Not Currently    Comment:  hx of alcohol abuse, quit 2012   Drug use: No   Sexual activity: Not on file    Comment: Married  Other Topics Concern   Not on file  Social History Narrative   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   Married   Drinks 4-6 caffeinated beverages daily   Social Determinants of Corporate investment banker Strain: Not on file  Food Insecurity: Not on file  Transportation Needs: Not on file  Physical Activity: Not on file  Stress: Not on file  Social Connections: Not on file  Intimate Partner Violence: Not on file    Physical Exam: Vital signs in last 24 hours: @BP  117/68 (BP Location: Right Arm, Patient Position: Sitting, Cuff Size: Normal)   Pulse 80   Temp 97.8 F (36.6 C) (Temporal)   Ht 6\' 6"  (1.981 m)   Wt 219 lb (99.3 kg)   SpO2 97%   BMI 25.31 kg/m  GEN: NAD EYE: Sclerae anicteric ENT: MMM CV: Non-tachycardic Pulm: CTA b/l GI: Soft, NT/ND NEURO:  Alert & Oriented x 3   Doristine Locks, DO [facility]   07/26/2023 2:30 PM

## 2023-07-26 NOTE — Op Note (Signed)
[facility] Patient Name: Sean Ray Procedure Date: 07/26/2023 2:25 PM MRN: [id number] Endoscopist: Doristine Locks , MD, [id number] Age: 52 Referring MD:  Date of Birth: [birth year] Gender: Male Account #: [id number] Procedure:                Colonoscopy Indications:              Hematochezia, Change in bowel habits, Diarrhea                           52 yo male with longer-standing hx of diarrhea and                            previously unremarkable work-up. More recently with                            change in bowel habits, blood and mucus like                            material mixed in the stool. Underwent colonoscopy                            in 03/2023 which showed a localized area of                            inflammation in the sigmoid colon at 25-30 cm from                            the anal verge, along with inflammation in the                            distal rectum. Biopsies from each of these areas                            showed active colitis without chronic inflammatory                            changes. Was treated with Canasa which initially                            had good clinical response and resolution of                            hematochezia. However, symptoms have since returned                            and fecal calprotectin now significantly elevated                            at 5000. Heightened concern about this really being                            early IBD (ie, path with only acute inflammatory  changes and not yet developed architectural                            distortion, granulomata, or other features of                            chronicity). Presents today for repeat colonoscopy                            due to worsening symptoms. Medicines:                Monitored Anesthesia Care Procedure:                Pre-Anesthesia Assessment:                           - Prior to the procedure,  a History and Physical                            was performed, and patient medications and                            allergies were reviewed. The patient's tolerance of                            previous anesthesia was also reviewed. The risks                            and benefits of the procedure and the sedation                            options and risks were discussed with the patient.                            All questions were answered, and informed consent                            was obtained. Prior Anticoagulants: The patient has                            taken no anticoagulant or antiplatelet agents. ASA                            Grade Assessment: II - A patient with mild systemic                            disease. After reviewing the risks and benefits,                            the patient was deemed in satisfactory condition to                            undergo the procedure.  After obtaining informed consent, the colonoscope                            was passed under direct vision. Throughout the                            procedure, the patient's blood pressure, pulse, and                            oxygen saturations were monitored continuously. The                            Olympus CF-HQ190L ([id number]) Colonoscope was                            introduced through the anus and advanced to the 10                            cm into the ileum. The colonoscopy was performed                            without difficulty. The patient tolerated the                            procedure well. The quality of the bowel                            preparation was good. The terminal ileum, ileocecal                            valve, appendiceal orifice, and rectum were                            photographed. Scope In: 2:40:07 PM Scope Out: 3:10:39 PM Scope Withdrawal Time: 0 hours 25 minutes 49 seconds  Total Procedure Duration: 0 hours 30  minutes 32 seconds  Findings:                 The perianal and digital rectal examinations were                            normal.                           A 6 mm polyp was found in the ascending colon. The                            polyp was flat and mucous-capped. The polyp was                            removed with a cold snare. Resection and retrieval                            were complete. Estimated blood loss was minimal.  Localized mild inflammation characterized by                            congestion (edema) and erythema was found in the                            cecum surrounding the appendiceal orifice. Biopsies                            were taken with a cold forceps for histology.                            Estimated blood loss was minimal.                           Normal mucosa was found in the proximal sigmoid                            colon, in the descending colon, in the transverse                            colon and in the ascending colon. Biopsies were                            taken with a cold forceps for histology. Estimated                            blood loss was minimal.                           Moderate inflammation characterized by congestion                            (edema), erythema and aphthous ulcerations was                            found in the rectum, in the recto-sigmoid colon, in                            the mid sigmoid colon and in the distal sigmoid                            colon. This was most pronounced in the sigmoid                            colon, and worse compared with the previous                            colonoscopy. Biopsies were taken with a cold                            forceps for histology. Estimated blood loss was  minimal.                           Non-bleeding internal hemorrhoids were found during                            retroflexion. The hemorrhoids  were small.                           Multiple small-mouthed diverticula were found in                            the sigmoid colon.                           There was a small lipoma, in the transverse colon.                           There were a few small, scattered area of mildly                            erythematous in the terminal ileum. The mucosa in                            between was normal appearing. Biopsies were taken                            with a cold forceps for histology. Estimated blood                            loss was minimal. Complications:            No immediate complications. Estimated Blood Loss:     Estimated blood loss was minimal. Impression:               - One 6 mm polyp in the ascending colon, removed                            with a cold snare. Resected and retrieved.                           - Localized mild inflammation was found in the                            cecum and at the appendiceal orifice. Biopsied.                           - Normal mucosa in the proximal sigmoid colon, in                            the descending colon, in the transverse colon and                            in the ascending colon. Biopsied.                           -  Moderate inflammation was found in the rectum, in                            the recto-sigmoid colon, in the mid sigmoid colon                            and in the distal sigmoid colon. Biopsied.                           - Non-bleeding internal hemorrhoids.                           - Diverticulosis in the sigmoid colon.                           - Small lipoma in the transverse colon.                           - Erythematous mucosa in the terminal ileum.                            Biopsied. Recommendation:           - Patient has a contact number available for                            emergencies. The signs and symptoms of potential                            delayed complications were discussed  with the                            patient. Return to normal activities tomorrow.                            Written discharge instructions were provided to the                            patient.                           - Resume previous diet.                           - Await pathology results.                           - Start Prednisone 40 mg PO daily x2 weeks, then                            wean as follow:                           - 30 mg x1 week.                           - 20  mg x1 week.                           - 15 mg x1 week.                           - 10 mg x1 week.                           - 5 mg x1 week then stop.                           - Check Quantiferon gold, HBsAb, HBsAg, HAV Ab,                            TPMT lab.                           - Follow-up with me in the GI clinic in the next                            2-4 weeks. Will discuss options for escalation of                            therapy.                           - Stop Lialda. Doristine Locks, MD 07/26/2023 3:27:29 PM

## 2023-07-26 NOTE — Progress Notes (Signed)
Uneventful anesthetic. Report to pacu rn. Vss. Care resumed by rn. 

## 2023-07-27 ENCOUNTER — Telehealth: Payer: Self-pay

## 2023-07-27 NOTE — Telephone Encounter (Signed)
  Follow up Call-     07/26/2023    2:13 PM 07/26/2023    2:06 PM 03/27/2023    3:09 PM  Call back number  Post procedure Call Back phone  # [phone]  [phone]  Permission to leave phone message  Yes Yes     Patient questions:  Do you have a fever, pain , or abdominal swelling? No. Pain Score  0 *  Have you tolerated food without any problems? Yes.    Have you been able to return to your normal activities? Yes.    Do you have any questions about your discharge instructions: Diet   No. Medications  No. Follow up visit  No.  Do you have questions or concerns about your Care? No.  Actions: * If pain score is 4 or above: No action needed, pain <4.

## 2023-07-29 ENCOUNTER — Other Ambulatory Visit: Payer: Self-pay | Admitting: Gastroenterology

## 2023-07-29 LAB — QUANTIFERON-TB GOLD PLUS
Mitogen-NIL: 5.97 [IU]/mL
NIL: 0.03 [IU]/mL
QuantiFERON-TB Gold Plus: NEGATIVE
TB1-NIL: 0 [IU]/mL
TB2-NIL: 0 [IU]/mL

## 2023-08-04 ENCOUNTER — Other Ambulatory Visit: Payer: Self-pay | Admitting: *Deleted

## 2023-08-07 LAB — HEPATITIS B SURFACE ANTIBODY,QUALITATIVE: Hep B S Ab: NONREACTIVE

## 2023-08-07 LAB — HEPATITIS B SURFACE ANTIGEN: Hepatitis B Surface Ag: NONREACTIVE

## 2023-08-07 LAB — THIOPURINE METHYLTRANSFERASE (TPMT), RBC: Thiopurine Methyltransferase, RBC: 8 nmol/hr/mL RBC — ABNORMAL LOW

## 2023-08-07 LAB — HEPATITIS A ANTIBODY, TOTAL: Hepatitis A AB,Total: REACTIVE — AB

## 2023-08-07 LAB — SURGICAL PATHOLOGY

## 2023-08-08 ENCOUNTER — Ambulatory Visit: Payer: BC Managed Care – PPO | Admitting: Gastroenterology

## 2023-08-08 ENCOUNTER — Encounter: Payer: Self-pay | Admitting: Gastroenterology

## 2023-08-19 ENCOUNTER — Other Ambulatory Visit: Payer: Self-pay | Admitting: Gastroenterology

## 2023-08-19 DIAGNOSIS — K523 Indeterminate colitis: Secondary | ICD-10-CM

## 2023-08-19 DIAGNOSIS — K5010 Crohn's disease of large intestine without complications: Secondary | ICD-10-CM

## 2023-08-21 ENCOUNTER — Encounter: Payer: Self-pay | Admitting: Gastroenterology

## 2023-08-21 MED ORDER — PREDNISONE 10 MG PO TABS: ORAL_TABLET | ORAL | 0 refills | Status: DC

## 2023-08-25 ENCOUNTER — Other Ambulatory Visit: Payer: BC Managed Care – PPO

## 2023-08-25 ENCOUNTER — Ambulatory Visit: Payer: BC Managed Care – PPO | Admitting: Gastroenterology

## 2023-08-25 ENCOUNTER — Encounter: Payer: Self-pay | Admitting: Gastroenterology

## 2023-08-25 ENCOUNTER — Other Ambulatory Visit: Payer: Self-pay | Admitting: Gastroenterology

## 2023-08-25 VITALS — BP 122/68 | HR 79 | Ht 78.0 in | Wt 220.0 lb

## 2023-08-25 DIAGNOSIS — K523 Indeterminate colitis: Secondary | ICD-10-CM

## 2023-08-25 DIAGNOSIS — Z23 Encounter for immunization: Secondary | ICD-10-CM

## 2023-08-25 DIAGNOSIS — K5180 Other ulcerative colitis without complications: Secondary | ICD-10-CM

## 2023-08-25 DIAGNOSIS — K580 Irritable bowel syndrome with diarrhea: Secondary | ICD-10-CM

## 2023-08-25 MED ORDER — MESALAMINE 4 G RE ENEM: 4.0000 g | ENEMA | Freq: Every day | RECTAL | 1 refills | Status: DC

## 2023-08-25 MED ORDER — MESALAMINE 1.2 G PO TBEC: 4.8000 g | DELAYED_RELEASE_TABLET | Freq: Every day | ORAL | 1 refills | Status: DC

## 2023-08-25 MED ORDER — PREDNISONE 10 MG PO TABS: ORAL_TABLET | ORAL | 0 refills | Status: DC

## 2023-08-25 NOTE — Patient Instructions (Addendum)
_______________________________________________________  If your blood pressure at your visit was 140/90 or greater, please contact your primary care physician to follow up on this.  If you are age 54 or younger, your body mass index should be between 19-25. Your Body mass index is 25.42 kg/m. If this is out of the aformentioned range listed, please consider follow up with your Primary Care Provider.  ________________________________________________________  The [facility] providers would like to encourage you to use [facility] to communicate with providers for non-urgent requests or questions.  Due to long hold times on the telephone, sending your provider a message by [facility] may be a faster and more efficient way to get a response.  Please allow 48 business hours for a response.  Please remember that this is for non-urgent requests.  _______________________________________________________  Your provider has requested that you go to the basement level for lab work before leaving today. Press "B" on the elevator. The lab is located at the first door on the left as you exit the elevator.  You received your 1st Heplisav injection today.    Due to recent changes in healthcare laws, you may see the results of your imaging and laboratory studies on MyChart before your provider has had a chance to review them.  We understand that in some cases there may be results that are confusing or concerning to you. Not all laboratory results come back in the same time frame and the provider may be waiting for multiple results in order to interpret others.  Please give [place] 48 hours in order for your provider to thoroughly review all the results before contacting the office for clarification of your results.   We have sent the following medications to your pharmacy for you to pick up at your convenience:  RESTART: 40mg  one tablet daily for 2 weeks, then taper by 5 mg daily Lialda 4.8 g daily Rowasa enema at  bedtime  We will work on getting [employer] approved.  It was a pleasure to see you today!  Vito Cirigliano, D.O.

## 2023-08-25 NOTE — Progress Notes (Signed)
Chief Complaint:    Ulcerative Colitis  GI History: 52 year old male with a history of anxiety, depression, bipolar, peripheral neuropathy and hypertension, follows in the GI clinic for the following:   1) Chronic diarrhea.  Longstanding history of chronic, loose, nonbloody stools for many years.  Typically passes 2 loose stools daily.  Not frank diarrhea. Previous unremarkable colonoscopy in 11/2008 and 07/2019. Negative celiac serology in 2009.   2) Colitis.  Presented to the GI clinic on 03/23/2023 with newer onset episodic, painless BRBPR superimposed on longer standing history of chronic loose stools.  Not much change with Benefiber and Imodium on demand along with topical therapy for hemorrhoids noted on exam. - 03/27/2023: Colonoscopy: 2 mm transverse colon hyperplastic polyp, 4 mm sigmoid polyp. Localized inflammation in sigmoid located 25-30 cm from anal verge and an area of dense diverticulosis (path: Active colitis with erosion without chronic inflammatory changes). Localized mild inflammation with aphthous ulcers in the distal rectum (path: Active colitis with erosion without chronic inflammatory changes or granuloma). Left-sided diverticulosis, small transverse colon lipoma. Remainder of the colonic mucosa was normal-appearing with benign biopsies. Normal TI. Small internal hemorrhoids. Started on Canasa suppositories - 06/06/2023: Follow-up in the GI clinic.  Rectal bleeding stopped and overall symptom improvement with Canasa suppositories.  Still with 2 looser BM/day, but not changed from baseline.  No nocturnal stools, tenesmus.  Started fiber supplement and low FODMAP diet. - 07/05/2023: Message from patient with increased stool frequency 2/day with episodic BRB.  Started Lialda 4.8 g daily with continued Canasa.  Normal ESR/CRP.  Fecal calprotectin 4960 -07/20/2023: Follow-up in GI clinic.  No change with Lialda.  Stopped Canasa due to concern for paradoxical worsening colitis.  Stopped  Lialda.  Negative GI path panel. - 07/26/2023: Colonoscopy: 6 mm ascending colon SSP, mild periappendiceal colitis (path: Mild active colitis without chronic changes), Moderate inflammation with aphthous ulceration in rectum through mid sigmoid colon, most pronounced in the sigmoid and worse compared with previous (path: Moderate chronic active colitis, focal active proctitis).  Otherwise normal mucosa from proximal sigmoid through ascending colon (path: Benign).  Small internal hemorrhoids, sigmoid diverticulosis, transverse colon lipoma.  A few small scattered areas of mildly erythematous mucosa in the TI (path benign).  Started on prednisone with slow taper, stopped Lialda altogether, made preparations for induction of biologic therapy. 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  QuantiFERON gold negative. HAV Ab+, HBsAg-, HBsAb- (scheduled HBV vaccine series)     Endoscopic History: - 11/2008: Colonoscopy: Mildly erythematous colon from rectum to mid ascending (path benign and without inflammation).  Biopsies negative for [facility], IBD - 07/2019: Colonoscopy: Normal.  Biopsies negative for MC, IBD.  Repeat in 10 years - 03/27/2023: Colonoscopy: 2 mm transverse colon hyperplastic polyp, 4 mm sigmoid polyp (resected, but disintegrated upon retrieved.  No pathology).  Localized inflammation in sigmoid located 25-30 cm from anal verge and an area of dense diverticulosis (path: Active colitis with erosion without chronic inflammatory changes).  Localized mild inflammation with aphthous ulcers in the distal rectum (path: Active colitis with erosion without chronic inflammatory changes or granuloma).  Left-sided diverticulosis, small transverse colon lipoma.  Remainder of the colonic mucosa was normal-appearing with benign biopsies.  Normal TI.  Small internal hemorrhoids.  Started on Canasa suppositories - 07/26/2023: Colonoscopy: 6 mm ascending colon SSP, mild periappendiceal colitis (path: Mild active colitis  without chronic changes), Moderate inflammation with aphthous ulceration in rectum through mid sigmoid colon, most pronounced in the sigmoid and worse compared with  previous (path: Moderate chronic active colitis, focal active proctitis).  Otherwise normal mucosa from proximal sigmoid through ascending colon (path: Benign).  Small internal hemorrhoids, sigmoid diverticulosis, transverse colon lipoma.  A few small scattered areas of mildly erythematous mucosa in the TI (path benign).     Family history notable for father with colon polyps.  No family history of IBD or CRC.  HPI:     Patient is a 52 y.o. male presenting to the Gastroenterology Clinic for follow-up.   Slowly tapering prednisone, currently at 25 mg daily.  He would like symptoms have improved only somewhat. Currently with 3 loose stools daily and but still has BRB mixed in stool and on tissue paper.  No abdominal pain.  Good p.o. intake.  No new imaging or labs since colonoscopy.   Review of systems:     No chest pain, no SOB, no fevers, no urinary sx   Past Medical History:  Diagnosis Date   Anxiety    Bipolar disorder (HCC)    Blood transfusion without reported diagnosis    at age 32    Chronic kidney disease    Acute Kidney injury per pt. 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       lisinopril (PRINIVIL,ZESTRIL) 10 MG tablet Take 1 tablet (10 mg total) by mouth daily. 90 tablet 1   Multiple Vitamin (MULTIVITAMIN) tablet Take 1 tablet by mouth daily.     predniSONE (DELTASONE) 10 MG tablet Start Prednisone 40 mg PO daily x2 weeks, then wean as follow:30 mg x1 week., 20 mg x1 week, 15  mg x1 week, 10 mg x1 week, 5 mg x1 week then stop 50 tablet 0   vitamin C (ASCORBIC ACID) 500 MG tablet Take 500 mg by mouth daily.     mesalamine (CANASA) 1000 MG suppository Place 1 suppository (1,000 mg total) rectally at bedtime. (Patient not taking: Reported on 08/25/2023) 60 suppository 3   mesalamine (LIALDA) 1.2 g EC tablet TAKE 2 TABLETS BY MOUTH TWICE DAILY X 8 WEEKS, THEN REDUCE TO 2 TABLETS ONCE DAILY THEREAFTER (Patient not taking: Reported on 08/25/2023) 360 tablet 0   No current facility-administered medications for this visit.    Physical Exam:     BP 122/68   Pulse 79   Ht 6\' 6"  (1.981 m)   Wt 220 lb (99.8 kg)   SpO2 99%   BMI 25.42 kg/m   GENERAL:  Pleasant male in NAD PSYCH: : Cooperative, normal affect NEURO: Alert and oriented x 3, no focal neurologic deficits   IMPRESSION and PLAN:    1) Ulcerative Colitis 2) Hematochezia In-depth conversation with the patient and his wife today.  After repeat colonoscopy last month, now seemingly much more consistent with Ulcerative Colitis with cecal patch and mild backwash ileitis.  Previously discussed the possibility of reclassification to Crohn's.  We had an extensive conversation regarding ongoing medical management.  Has had a somewhat suboptimal response to prednisone so far.  We discussed transition to biologic therapy at length, with discussion of the risk/benefit profile of each of these medications.  Not a candidate for immunomodulators due to TPMT level of 8.  Ultimately, he and his wife would actually like to retrial mesalamine therapy to give that 1 more chance since he only took the Lialda for a very short period of time (<1 week).  We again discussed the possibility of paradoxical inflammation with mesalamine.  After a very good conversation and shared decision making, they would like to proceed with the following:  - Increase prednisone back to 40 mg daily x 2 weeks, then restart slow taper decreasing by 5 mg  every 7 days - Restart Lialda 4.8 g daily - Rowasa enemas at bedtime - To message me in 2-3 weeks to let me know how he is doing.  If no significant response to therapy, plan to transition to biologic therapy. - As above, had in-depth conversation regarding biologic agents today.  He would like to submit for prior authorization for Entyvio - Recheck fecal calprotectin today for continued surrogate marker of response to therapy - DEXA scan 6+ months after completion of steroids - Repeat CBC and BMP in 2-3 weeks - Start hepatitis B vaccine series today  3) Diarrhea Does have a pre-existing history of loose and variable stools for 20+ years.  Suspect he has underlying IBS-D which does complicate measuring response to therapy a bit  RTC in 2-3 months or sooner as needed  I spent 45 minutes of time, including in depth chart review, independent review of results as outlined above, communicating results with the patient directly, face-to-face time with the patient, coordinating care, ordering studies and medications as appropriate, and documentation.       Verlin Dike Carle Dargan ,DO, FACG 08/25/2023, 10:18 AM

## 2023-08-28 ENCOUNTER — Other Ambulatory Visit: Payer: BC Managed Care – PPO

## 2023-08-28 ENCOUNTER — Telehealth: Payer: Self-pay | Admitting: *Deleted

## 2023-08-28 ENCOUNTER — Encounter: Payer: Self-pay | Admitting: Gastroenterology

## 2023-08-28 DIAGNOSIS — K5180 Other ulcerative colitis without complications: Secondary | ICD-10-CM

## 2023-08-28 DIAGNOSIS — K5190 Ulcerative colitis, unspecified, without complications: Secondary | ICD-10-CM | POA: Insufficient documentation

## 2023-08-28 DIAGNOSIS — K523 Indeterminate colitis: Secondary | ICD-10-CM

## 2023-08-28 NOTE — Telephone Encounter (Signed)
Dorothy, [employer] has been submitted and we will f/u once it has been approved.  Thanks Selena Batten

## 2023-08-28 NOTE — Addendum Note (Signed)
Addended by: Richardson Chiquito on: 08/28/2023 10:16 AM   Modules accepted: Orders

## 2023-08-28 NOTE — Telephone Encounter (Signed)
[name]-  Would you please help with this patient? He needs Entvyio induction and maintenance dosing approval from insurance.  Thank you!

## 2023-08-30 LAB — CALPROTECTIN, FECAL: Calprotectin, Fecal: 99 ug/g (ref 0–120)

## 2023-09-01 ENCOUNTER — Telehealth: Payer: Self-pay

## 2023-09-01 NOTE — Telephone Encounter (Signed)
See 09/01/23 telephone note for additional information.

## 2023-09-01 NOTE — Telephone Encounter (Signed)
Dr. Barron Alvine, patient will be scheduled as soon as possible.  Auth Submission: APPROVED Site of care: Site of care: [facility] Payer: BCBS Medication & CPT/J Code(s) submitted: Entyvio (Vedolizumab) [id number] Route of submission (phone, fax, portal): fax Phone # Fax # [phone]  Auth type: Buy/Bill PB Units/visits requested: 300mg  x 9 doses Reference number: [id number] Approval from: 08/28/23 to 08/27/24

## 2023-09-07 NOTE — Telephone Encounter (Signed)
Wyvonne Lenz, RN10/18/2024  1:39 PMPatient states he is currently on alternative treatments that are working well. Requested that we close this referral at this time. Patient is aware that [place] authorization was approved until 08/27/24.

## 2023-09-11 ENCOUNTER — Other Ambulatory Visit: Payer: Self-pay | Admitting: Gastroenterology

## 2023-09-16 ENCOUNTER — Encounter: Payer: Self-pay | Admitting: Gastroenterology

## 2023-09-18 MED ORDER — MESALAMINE 4 G RE ENEM: 4.0000 g | ENEMA | Freq: Every day | RECTAL | 1 refills | Status: DC

## 2023-09-25 ENCOUNTER — Ambulatory Visit (INDEPENDENT_AMBULATORY_CARE_PROVIDER_SITE_OTHER): Payer: BC Managed Care – PPO | Admitting: Gastroenterology

## 2023-09-25 DIAGNOSIS — Z23 Encounter for immunization: Secondary | ICD-10-CM | POA: Diagnosis not present

## 2023-09-25 DIAGNOSIS — K5180 Other ulcerative colitis without complications: Secondary | ICD-10-CM

## 2023-09-25 NOTE — Progress Notes (Signed)
See nursing notes for vaccine administration.

## 2023-10-29 ENCOUNTER — Other Ambulatory Visit: Payer: Self-pay | Admitting: Gastroenterology

## 2023-10-30 ENCOUNTER — Encounter: Payer: Self-pay | Admitting: Gastroenterology

## 2023-10-30 MED ORDER — MESALAMINE 4 G RE ENEM: 4.0000 g | ENEMA | Freq: Every day | RECTAL | 1 refills | Status: DC

## 2023-11-13 ENCOUNTER — Encounter: Payer: Self-pay | Admitting: Gastroenterology

## 2023-11-23 ENCOUNTER — Encounter: Payer: Self-pay | Admitting: Gastroenterology

## 2023-12-08 ENCOUNTER — Ambulatory Visit: Payer: 59 | Admitting: Nurse Practitioner

## 2023-12-08 ENCOUNTER — Encounter: Payer: Self-pay | Admitting: Nurse Practitioner

## 2023-12-08 VITALS — BP 130/60 | HR 68 | Ht 78.0 in | Wt 231.5 lb

## 2023-12-08 DIAGNOSIS — K51919 Ulcerative colitis, unspecified with unspecified complications: Secondary | ICD-10-CM

## 2023-12-08 DIAGNOSIS — K5190 Ulcerative colitis, unspecified, without complications: Secondary | ICD-10-CM

## 2023-12-08 MED ORDER — PRAMOXINE-HC 1-1 % EX CREA: TOPICAL_CREAM | Freq: Two times a day (BID) | CUTANEOUS | 1 refills | Status: DC | PRN

## 2023-12-08 NOTE — Patient Instructions (Addendum)
Your provider has requested that you go to the basement level for lab work before leaving today. Press "B" on the elevator. The lab is located at the first door on the left as you exit the elevator.  Continue oral Mesalamine 1.2 gram- take 2 tablets by mouth daily  Contact our office if colitis flare symptoms occur.  Follow up with Dr.Cirigliano in 3-4 months  Due to recent changes in healthcare laws, you may see the results of your imaging and laboratory studies on MyChart before your provider has had a chance to review them.  We understand that in some cases there may be results that are confusing or concerning to you. Not all laboratory results come back in the same time frame and the provider may be waiting for multiple results in order to interpret others.  Please give [place] 48 hours in order for your provider to thoroughly review all the results before contacting the office for clarification of your results.   Thank you for trusting me with your gastrointestinal care!   Alcide Evener, CRNP

## 2023-12-08 NOTE — Progress Notes (Signed)
12/08/2023 Sean Ray [id number] 07-02-1971   Chief Complaint: Ulcerative colitis follow-up  History of Present Illness: Sean Ray is a 53 year old male with a past medical history of of anxiety, depression, bipolar disorder, peripheral neuropathy, hypertension, colon polyps and ulcerative colitis.  He is followed by Dr. Barron Alvine.  He presents today for ulcerative colitis follow-up.  He is accompanied by his wife.  He was taking oral Mesalamine 1.2 g 4 tabs daily and 2 months ago he reduced Mesalamine to 2 tabs daily.  He stopped to Rowasa enemas 11/13/2023.  He had intermittent anal itchiness which he initially thought was secondary to Rowasa but when his anal itchiness recurred several weeks after he discontinued Rowasa and since then has diminished.  He is passing 2 small formed bowel movements daily.  No bloody or black stools.  No mucus per the rectum.  No abdominal pain.  He eats a fairly healthy diet.  Avoids NSAIDs.  PAST GI WORK UP:   -He underwent a colonoscopy 11/2008 and 07/2019 due to having chronic diarrhea without rectal bleeding which did not show any evidence of microscopic colitis, inflammatory bowel disease or colon polyps.  Negative celiac serology in 2009.  Father with history of colon polyps. No known family history of colorectal cancer or IBD.   -He developed BRBPR in setting of chronic loose stools and underwent a colonoscopy 03/27/2023 which showed the following:  Localized inflammation in sigmoid located 25-30 cm from anal verge and an area of dense diverticulosis (path: Active colitis with erosion without chronic inflammatory changes). Localized mild inflammation with aphthous ulcers in the distal rectum (path: Active colitis with erosion without chronic inflammatory changes or granuloma). Left-sided diverticulosis, small transverse colon lipoma. Remainder of the colonic mucosa was normal-appearing with benign biopsies. Normal TI. Small internal hemorrhoids.  Started on Canasa suppositories - 06/06/2023: Follow-up in the GI clinic.  Rectal bleeding stopped and overall symptom improvement with Canasa suppositories.  Still with 2 looser BM/day, but not changed from baseline.  No nocturnal stools, tenesmus.  Started fiber supplement and low FODMAP diet. - 07/05/2023: Message from patient with increased stool frequency 2/day with episodic BRB.  Started Lialda 4.8 g daily with continued Canasa.  Normal ESR/CRP.  Fecal calprotectin 4960 -07/20/2023: Follow-up in GI clinic.  No change with Lialda.  Stopped Canasa due to concern for paradoxical worsening colitis.  Stopped Lialda.  Negative GI path panel. - 07/26/2023: Colonoscopy: 6 mm ascending colon SSP, mild periappendiceal colitis (path: Mild active colitis without chronic changes), Moderate inflammation with aphthous ulceration in rectum through mid sigmoid colon, most pronounced in the sigmoid and worse compared with previous (path: Moderate chronic active colitis, focal active proctitis).  Otherwise normal mucosa from proximal sigmoid through ascending colon (path: Benign).  Small internal hemorrhoids, sigmoid diverticulosis, transverse colon lipoma.  A few small scattered areas of mildly erythematous mucosa in the TI (path benign).  Started on prednisone with slow taper, stopped Lialda altogether, made preparations for induction of biologic therapy. 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  QuantiFERON gold negative. HAV Ab+, HBsAg-, HBsAb- (scheduled HBV vaccine series)         Latest Ref Rng & Units 10/07/2008    3:14 PM 01/08/2008    1:31 PM 10/30/2007    3:01 PM  CBC  WBC 4.5 - 10.5 10*3/microliter 5.5  5.7  6.7   Hemoglobin 13.0 - 17.0 g/dL 18.8  41.6  60.6   Hematocrit 39.0 - 52.0 % 41.1  42.4  43.0   Platelets 150 - 400 K/uL 214  189  229        Latest Ref Rng & Units 09/15/2010   12:20 PM 03/09/2009    3:33 PM 10/07/2008    3:14 PM  CMP  Glucose 70 - 99 mg/dL 93  95  79   BUN 6 - 23  mg/dL 14  11  11    Creatinine 0.4 - 1.5 mg/dL 1.2  4.09  1.0   Sodium 135 - 145 meq/L 142  142  141   Potassium 3.5 - 5.1 meq/L 3.7  3.7  3.7   Chloride 96 - 112 meq/L 110  110  108   CO2 19 - 32 meq/L 27  25  29    Calcium 8.4 - 10.5 mg/dL 9.3  9.5  9.1   Total Protein 6.0 - 8.3 g/dL 6.3  6.5  6.6   Total Bilirubin 0.3 - 1.2 mg/dL 1.0  0.8  1.1   Alkaline Phos 39 - 117 units/L 87  79  76   AST 0 - 37 units/L 20  22  23    ALT 0 - 53 units/L 17  21  23      Current Outpatient Medications on File Prior to Visit  Medication Sig Dispense Refill   lamoTRIgine (LAMICTAL) 200 MG tablet Take 200 mg by mouth daily.       lisinopril (PRINIVIL,ZESTRIL) 10 MG tablet Take 1 tablet (10 mg total) by mouth daily. 90 tablet 1   mesalamine (LIALDA) 1.2 g EC tablet Take 4 tablets (4.8 g total) by mouth daily. (Patient taking differently: Take 2.4 g by mouth daily.) 120 tablet 1   mesalamine (ROWASA) 4 g enema Place 60 mLs (4 g total) rectally at bedtime. Lay on left side, retain as long as able (Patient not taking: Reported on 12/08/2023) 1680 mL 1   No current facility-administered medications on file prior to visit.   Allergies  Allergen Reactions   Penicillins Swelling    REACTION: as child, swelling   Current Medications, Allergies, Past Medical History, Past Surgical History, Family History and Social History were reviewed in [employer] record.  Review of Systems:   Constitutional: Negative for fever, sweats, chills or weight loss.  Respiratory: Negative for shortness of breath.   Cardiovascular: Negative for chest pain, palpitations and leg swelling.  Gastrointestinal: See HPI.  Musculoskeletal: Negative for back pain or muscle aches.  Neurological: Negative for dizziness, headaches or paresthesias.   Physical Exam: BP 130/60 (BP Location: Left Arm, Patient Position: Sitting, Cuff Size: Normal)   Pulse 68   Ht 6\' 6"  (1.981 m)   Wt 231 lb 8 oz (105 kg)   BMI 26.75  kg/m   General: 53 year old male in no acute distress. Head: Normocephalic and atraumatic. Eyes: No scleral icterus. Conjunctiva pink . Ears: Normal auditory acuity. Mouth: Dentition intact. No ulcers or lesions.  Lungs: Clear throughout to auscultation. Heart: Regular rate and rhythm, no murmur. Abdomen: Soft, nontender and nondistended. No masses or hepatomegaly. Normal bowel sounds x 4 quadrants.  Rectal: Deferred.  Musculoskeletal: Symmetrical with no gross deformities. Extremities: No edema. Neurological: Alert oriented x 4. No focal deficits.  Psychological: Alert and cooperative. Normal mood and affect  Assessment and Recommendations:  53 year old male with ulcerative colitis initially diagnosed 03/2023, stable on Mesalamine 2.4 g p.o. daily. Off Rowasa enemas since 11/13/2023.  -CBC, CMP, CRP, vitamin D and fecal calprotectin  -May need to increase oral Mesalamine 1.2  g 4 tabs p.o. daily and restart Rowasa enemas if he develops loose bloody stools or if his fecal calprotectin level is significantly elevated -Dr. Barron Alvine to verify colonoscopy recall date --Analpram apply a small amount to the perianal area twice daily as needed for anal itchiness, not to apply for more than 7 consecutive days -Patient to follow-up with Dr. Barron Alvine in 3 to 4 months -Patient to contact our office if rectal bleeding or loose stools recur

## 2023-12-10 NOTE — Progress Notes (Signed)
Agree with the assessment and plan as outlined by Alcide Evener, NP.   Plan for close monitoring of symptoms given recent change in medication.  If elevated fecal calprotectin or increasing symptoms, agree with increasing mesalamine, restart Rowasa enema, and may need to consider escalation of therapy.  Has follow-up scheduled with me.   Doristine Locks, DO, [facility]

## 2023-12-15 NOTE — Telephone Encounter (Signed)
 Error

## 2023-12-18 ENCOUNTER — Other Ambulatory Visit (INDEPENDENT_AMBULATORY_CARE_PROVIDER_SITE_OTHER): Payer: 59

## 2023-12-18 DIAGNOSIS — K51919 Ulcerative colitis, unspecified with unspecified complications: Secondary | ICD-10-CM

## 2023-12-18 LAB — CBC WITH DIFFERENTIAL/PLATELET
Basophils Absolute: 0 10*3/uL (ref 0.0–0.1)
Basophils Relative: 0.7 % (ref 0.0–3.0)
Eosinophils Absolute: 0.4 10*3/uL (ref 0.0–0.7)
Eosinophils Relative: 7.3 % — ABNORMAL HIGH (ref 0.0–5.0)
HCT: 43.8 % (ref 39.0–52.0)
Hemoglobin: 15.2 g/dL (ref 13.0–17.0)
Lymphocytes Relative: 25.5 % (ref 12.0–46.0)
Lymphs Abs: 1.4 10*3/uL (ref 0.7–4.0)
MCHC: 34.7 g/dL (ref 30.0–36.0)
MCV: 87.5 fL (ref 78.0–100.0)
Monocytes Absolute: 0.4 10*3/uL (ref 0.1–1.0)
Monocytes Relative: 7.1 % (ref 3.0–12.0)
Neutro Abs: 3.2 10*3/uL (ref 1.4–7.7)
Neutrophils Relative %: 59.4 % (ref 43.0–77.0)
Platelets: 209 10*3/uL (ref 150.0–400.0)
RBC: 5 Mil/uL (ref 4.22–5.81)
RDW: 13 % (ref 11.5–15.5)
WBC: 5.4 10*3/uL (ref 4.0–10.5)

## 2023-12-18 LAB — COMPREHENSIVE METABOLIC PANEL
ALT: 19 U/L (ref 0–53)
AST: 18 U/L (ref 0–37)
Albumin: 4.3 g/dL (ref 3.5–5.2)
Alkaline Phosphatase: 67 U/L (ref 39–117)
BUN: 20 mg/dL (ref 6–23)
CO2: 25 meq/L (ref 19–32)
Calcium: 9 mg/dL (ref 8.4–10.5)
Chloride: 106 meq/L (ref 96–112)
Creatinine, Ser: 1.31 mg/dL (ref 0.40–1.50)
GFR: 61.52 mL/min (ref >60.00)
Glucose, Bld: 83 mg/dL (ref 70–99)
Potassium: 3.8 meq/L (ref 3.5–5.1)
Sodium: 140 meq/L (ref 135–145)
Total Bilirubin: 0.5 mg/dL (ref 0.2–1.2)
Total Protein: 6.9 g/dL (ref 6.0–8.3)

## 2023-12-18 LAB — VITAMIN D 25 HYDROXY (VIT D DEFICIENCY, FRACTURES): VITD: 29.49 ng/mL — ABNORMAL LOW (ref 30.00–100.00)

## 2023-12-18 LAB — C-REACTIVE PROTEIN: CRP: <1 mg/dL (ref 0.5–20.0)

## 2023-12-20 ENCOUNTER — Other Ambulatory Visit: Payer: 59

## 2023-12-20 DIAGNOSIS — K51919 Ulcerative colitis, unspecified with unspecified complications: Secondary | ICD-10-CM

## 2023-12-22 LAB — CALPROTECTIN, FECAL: Calprotectin, Fecal: 221 ug/g — ABNORMAL HIGH (ref 0–120)

## 2023-12-25 ENCOUNTER — Other Ambulatory Visit: Payer: Self-pay | Admitting: Gastroenterology

## 2024-01-24 DIAGNOSIS — K51919 Ulcerative colitis, unspecified with unspecified complications: Secondary | ICD-10-CM

## 2024-01-25 NOTE — Telephone Encounter (Signed)
 Hello POD B nurses, Jiles Prows and [place], please enter a fecal calprotectin order on this patient as he is followed by Dr. Barron Alvine.   Dr. Sherian Rein

## 2024-01-30 ENCOUNTER — Other Ambulatory Visit

## 2024-01-30 DIAGNOSIS — K51919 Ulcerative colitis, unspecified with unspecified complications: Secondary | ICD-10-CM

## 2024-02-01 LAB — CALPROTECTIN, FECAL: Calprotectin, Fecal: 677 ug/g — ABNORMAL HIGH (ref 0–120)

## 2024-02-15 ENCOUNTER — Ambulatory Visit: Admitting: Gastroenterology

## 2024-02-15 ENCOUNTER — Encounter: Payer: Self-pay | Admitting: Gastroenterology

## 2024-02-15 VITALS — BP 122/78 | HR 80 | Ht 78.0 in | Wt 232.0 lb

## 2024-02-15 DIAGNOSIS — K5130 Ulcerative (chronic) rectosigmoiditis without complications: Secondary | ICD-10-CM

## 2024-02-15 DIAGNOSIS — K580 Irritable bowel syndrome with diarrhea: Secondary | ICD-10-CM

## 2024-02-15 NOTE — Patient Instructions (Addendum)
 _______________________________________________________  If your blood pressure at your visit was 140/90 or greater, please contact your primary care physician to follow up on this.  If you are age 55 or younger, your body mass index should be between 19-25. Your Body mass index is 26.81 kg/m. If this is out of the aformentioned range listed, please consider follow up with your Primary Care Provider.  ________________________________________________________  The [facility] providers would like to encourage you to use [facility] to communicate with providers for non-urgent requests or questions.  Due to long hold times on the telephone, sending your provider a message by [facility] may be a faster and more efficient way to get a response.  Please allow 48 business hours for a response.  Please remember that this is for non-urgent requests.  _______________________________________________________  Your provider has requested that you go to the basement level for lab work in 3 months (July 2025). You will not need an appointment for lab work.  Press "B" on the elevator. The lab is located at the first door on the left as you exit the elevator.  You will need a follow up in 6 months.  We will contact you to when it is time to schedule this appointment.  Due to recent changes in healthcare laws, you may see the results of your imaging and laboratory studies on MyChart before your provider has had a chance to review them.  We understand that in some cases there may be results that are confusing or concerning to you. Not all laboratory results come back in the same time frame and the provider may be waiting for multiple results in order to interpret others.  Please give [place] 48 hours in order for your provider to thoroughly review all the results before contacting the office for clarification of your results.   It was a pleasure to see you today!  Vito Cirigliano, D.O.

## 2024-02-15 NOTE — Progress Notes (Signed)
 Chief Complaint:   Ulcerative Colitis  GI History: 53 year old male with a history of anxiety, depression, bipolar, peripheral neuropathy and hypertension, follows in the GI clinic for the following:   1) Chronic diarrhea.  Longstanding history of chronic, loose, nonbloody stools for many years.  Typically passes 2 loose stools daily.  Not frank diarrhea. Previous unremarkable colonoscopy in 11/2008 and 07/2019. Negative celiac serology in 2009.   2) Ulcerative Colitis.  Presented to the GI clinic on 03/23/2023 with newer onset episodic, painless BRBPR superimposed on longer standing history of chronic loose stools.  Not much change with Benefiber and Imodium on demand along with topical therapy for hemorrhoids noted on exam.  Ultimately diagnosed with steroid responsive Ulcerative Colitis with cecal patch and mild backwash ileitis. - 03/27/2023: Colonoscopy: 2 mm transverse colon hyperplastic polyp, 4 mm sigmoid polyp. Localized inflammation in sigmoid located 25-30 cm from anal verge and an area of dense diverticulosis (path: Active colitis with erosion without chronic inflammatory changes). Localized mild inflammation with aphthous ulcers in the distal rectum (path: Active colitis with erosion without chronic inflammatory changes or granuloma). Left-sided diverticulosis, small transverse colon lipoma. Remainder of the colonic mucosa was normal-appearing with benign biopsies. Normal TI. Small internal hemorrhoids. Started on Canasa suppositories - 06/06/2023: Follow-up in the GI clinic.  Rectal bleeding stopped and overall symptom improvement with Canasa suppositories.  Still with 2 looser BM/day, but not changed from baseline.  No nocturnal stools, tenesmus.  Started fiber supplement and low FODMAP diet. - 07/05/2023: Message from patient with increased stool frequency 2/day with episodic BRB.  Started Lialda 4.8 g daily with continued Canasa.  Normal ESR/CRP.  Fecal calprotectin 4960 -07/20/2023: Follow-up  in GI clinic.  No change with Lialda.  Stopped Canasa due to concern for paradoxical worsening colitis.  Stopped Lialda.  Negative GI path panel. - 07/26/2023: Colonoscopy: 6 mm ascending colon SSP, mild periappendiceal colitis (path: Mild active colitis without chronic changes), Moderate inflammation with aphthous ulceration in rectum through mid sigmoid colon, most pronounced in the sigmoid and worse compared with previous (path: Moderate chronic active colitis, focal active proctitis).  Otherwise normal mucosa from proximal sigmoid through ascending colon (path: Benign).  Small internal hemorrhoids, sigmoid diverticulosis, transverse colon lipoma.  A few small scattered areas of mildly erythematous mucosa in the TI (path benign).  Started on prednisone with slow taper, stopped Lialda altogether, made preparations for induction of biologic therapy. 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  QuantiFERON gold negative. HAV Ab+, HBsAg-, HBsAb- (scheduled HBV vaccine series) - 08/25/2023: Follow-up in GI clinic.  Restarted prednisone, and patient elected to restart Lialda instead of escalation to biologic therapy.  Calprotectin 99 - 12/08/2023: Follow-up in GI clinic.  Prescribed analpram for perianal itching and increased mesalamine back to 4.8 g/day.  CRP, CBC, CMP normal.  Calprotectin 221     Endoscopic History: - 11/2008: Colonoscopy: Mildly erythematous colon from rectum to mid ascending (path benign and without inflammation).  Biopsies negative for [facility], IBD - 07/2019: Colonoscopy: Normal.  Biopsies negative for [facility], IBD.  Repeat in 10 years - 03/27/2023: Colonoscopy: 2 mm transverse colon hyperplastic polyp, 4 mm sigmoid polyp (resected, but disintegrated upon retrieved.  No pathology).  Localized inflammation in sigmoid located 25-30 cm from anal verge and an area of dense diverticulosis (path: Active colitis with erosion without chronic inflammatory changes).  Localized mild inflammation with  aphthous ulcers in the distal rectum (path: Active colitis with erosion without chronic inflammatory changes or granuloma).  Left-sided  diverticulosis, small transverse colon lipoma.  Remainder of the colonic mucosa was normal-appearing with benign biopsies.  Normal TI.  Small internal hemorrhoids.  Started on Canasa suppositories - 07/26/2023: Colonoscopy: 6 mm ascending colon SSP, mild periappendiceal colitis (path: Mild active colitis without chronic changes), Moderate inflammation with aphthous ulceration in rectum through mid sigmoid colon, most pronounced in the sigmoid and worse compared with previous (path: Moderate chronic active colitis, focal active proctitis).  Otherwise normal mucosa from proximal sigmoid through ascending colon (path: Benign).  Small internal hemorrhoids, sigmoid diverticulosis, transverse colon lipoma.  A few small scattered areas of mildly erythematous mucosa in the TI (path benign).     Family history notable for father with colon polyps.  No family history of IBD or CRC.  HPI:     Patient is a 53 y.o. male presenting to the Gastroenterology Clinic for follow-up.  Was last seen in the GI clinic by calling Riley Kill on 12/08/2023.  Fecal calprotectin was 221, and since then has increased to 677 last month.   He reports the uptrending calprotectin was in the setting of holding the Rowasa at the last appointment as he was otherwise feeling well.  He continued on Lialda 4.8 g/day.  Then started developing looser stools, 3-4 stools/day, with BRB on tissue paper and restarted the Rowasa enemas with overall clinical improvement.  Now back to 2-3 stools daily.    Review of systems:     No chest pain, no SOB, no fevers, no urinary sx   Past Medical History:  Diagnosis Date   Anxiety    Bipolar disorder (HCC)    Blood transfusion without reported diagnosis    at age 60    Chronic kidney disease    Acute Kidney injury per pt. 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       lisinopril (PRINIVIL,ZESTRIL) 10 MG tablet Take 1 tablet (10 mg total) by mouth daily. 90 tablet 1   mesalamine (LIALDA) 1.2 g EC tablet Take 4 tablets (4.8 g total) by mouth daily. (Patient taking differently: Take 2.4 g by mouth daily.) 120 tablet 1   mesalamine (ROWASA) 4 g enema Place 60 mLs (4 g total) rectally at bedtime. 1800 mL 2   No current facility-administered medications for this visit.    Physical Exam:     BP 122/78   Pulse 80   Ht 6\' 6"  (1.981 m)   Wt 232 lb (105.2 kg)   BMI 26.81 kg/m   GENERAL:  Pleasant male in NAD PSYCH: : Cooperative, normal affect NEURO: Alert and oriented x 3, no focal neurologic deficits   IMPRESSION and PLAN:    1) Ulcerative Colitis 53 year old male with Ulcerative Colitis (rectosigmoiditis with cecal patch and backwash ileitis).  There was concern earlier for possible paradoxical response to 5-ASA therapy, but he has since actually done quite well with combination of Lialda and Rowasa.  Rowasa seemingly provides more clinical benefit, and he has had uptrending fecal calprotectin's and eventually breakthrough symptoms when removing the Rowasa in the last couple of months.  We again discussed escalation of therapy today.  We  discussed the pros and cons of transitioning to [place] vs one of the newer agents such as Velsipity, Tremfya, Omvoh.  He is not a candidate for immunomodulators due to TPMT level of 8.  Ultimately, he does not want to start any biologic therapy at this time and instead continue with 5-ASA therapy as follows:  - Continue Lialda, but reduce to 2.4 g  daily - Continue Rowasa enemas at bedtime for the foreseeable future - Repeat fecal calprotectin in 3 months.  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  Largely well-controlled currently.  RTC in 6-9 months or sooner prn  I spent 40 minutes of time, including in depth chart review, independent review of results as outlined above, communicating results with the patient directly, face-to-face time with the patient, coordinating care, ordering studies and medications as appropriate, and documentation.           Shellia Cleverly ,DO, FACG 02/15/2024, 2:47 PM

## 2024-03-04 ENCOUNTER — Other Ambulatory Visit: Payer: Self-pay | Admitting: Gastroenterology

## 2024-03-08 ENCOUNTER — Ambulatory Visit: Payer: 59 | Admitting: Gastroenterology

## 2024-03-22 ENCOUNTER — Other Ambulatory Visit: Payer: Self-pay | Admitting: Gastroenterology

## 2024-05-20 ENCOUNTER — Other Ambulatory Visit

## 2024-05-20 DIAGNOSIS — K5130 Ulcerative (chronic) rectosigmoiditis without complications: Secondary | ICD-10-CM

## 2024-05-25 LAB — CALPROTECTIN: Calprotectin: 1260 ug/g — ABNORMAL HIGH

## 2024-05-27 ENCOUNTER — Ambulatory Visit: Payer: Self-pay | Admitting: Gastroenterology

## 2024-05-27 ENCOUNTER — Encounter: Payer: Self-pay | Admitting: Gastroenterology

## 2024-05-27 NOTE — Telephone Encounter (Signed)
 Separate message sent to patient under results.

## 2024-05-27 NOTE — Telephone Encounter (Signed)
 Inbound call from patient returning phone call. Please advise, thank you.

## 2024-05-28 ENCOUNTER — Other Ambulatory Visit: Payer: Self-pay

## 2024-05-28 MED ORDER — PREDNISONE 10 MG PO TABS: ORAL_TABLET | ORAL | 0 refills | Status: AC

## 2024-06-24 DIAGNOSIS — K5130 Ulcerative (chronic) rectosigmoiditis without complications: Secondary | ICD-10-CM

## 2024-06-24 NOTE — Telephone Encounter (Signed)
 Dr. San patient with Ulcerative recto sigmoiditis, recent fecal calprotectin 1260. 7/15 Dr. San started patient on prednisone  40 mg daily x 2 weeks, then reduce by 10 mg every 7 days. Continue with Rowasa  and Lialda . Patient is wanting to repeat fecal calprotectin. Please advise, thanks

## 2024-06-27 ENCOUNTER — Other Ambulatory Visit

## 2024-06-27 DIAGNOSIS — K5130 Ulcerative (chronic) rectosigmoiditis without complications: Secondary | ICD-10-CM

## 2024-06-29 ENCOUNTER — Other Ambulatory Visit: Payer: Self-pay | Admitting: Gastroenterology

## 2024-07-01 ENCOUNTER — Ambulatory Visit: Payer: Self-pay | Admitting: Physician Assistant

## 2024-07-01 DIAGNOSIS — K5130 Ulcerative (chronic) rectosigmoiditis without complications: Secondary | ICD-10-CM

## 2024-07-01 LAB — CALPROTECTIN, FECAL: Calprotectin, Fecal: 24 ug/g (ref 0–120)

## 2024-08-02 ENCOUNTER — Other Ambulatory Visit

## 2024-08-05 LAB — CALPROTECTIN, FECAL: Calprotectin, Fecal: 26 ug/g (ref 0–120)

## 2024-08-06 ENCOUNTER — Ambulatory Visit: Payer: Self-pay | Admitting: Gastroenterology

## 2024-09-03 NOTE — Telephone Encounter (Signed)
 Dr. San, I am forwarding you patient's message, defer recommendations to you. Fecal calprotectin level was normal August and September 2025.

## 2024-09-04 ENCOUNTER — Other Ambulatory Visit: Payer: Self-pay

## 2024-09-04 DIAGNOSIS — K51919 Ulcerative colitis, unspecified with unspecified complications: Secondary | ICD-10-CM

## 2024-09-04 DIAGNOSIS — K580 Irritable bowel syndrome with diarrhea: Secondary | ICD-10-CM

## 2024-09-04 DIAGNOSIS — R197 Diarrhea, unspecified: Secondary | ICD-10-CM

## 2024-09-05 ENCOUNTER — Other Ambulatory Visit

## 2024-09-05 DIAGNOSIS — R197 Diarrhea, unspecified: Secondary | ICD-10-CM

## 2024-09-05 DIAGNOSIS — K51919 Ulcerative colitis, unspecified with unspecified complications: Secondary | ICD-10-CM

## 2024-09-05 DIAGNOSIS — K580 Irritable bowel syndrome with diarrhea: Secondary | ICD-10-CM

## 2024-09-11 ENCOUNTER — Ambulatory Visit: Admitting: Gastroenterology

## 2024-09-11 ENCOUNTER — Encounter: Payer: Self-pay | Admitting: Gastroenterology

## 2024-09-11 VITALS — BP 132/74 | HR 72 | Ht 78.0 in | Wt 226.5 lb

## 2024-09-11 DIAGNOSIS — K5190 Ulcerative colitis, unspecified, without complications: Secondary | ICD-10-CM | POA: Diagnosis not present

## 2024-09-11 DIAGNOSIS — K580 Irritable bowel syndrome with diarrhea: Secondary | ICD-10-CM

## 2024-09-11 DIAGNOSIS — Z79899 Other long term (current) drug therapy: Secondary | ICD-10-CM | POA: Diagnosis not present

## 2024-09-11 DIAGNOSIS — K51919 Ulcerative colitis, unspecified with unspecified complications: Secondary | ICD-10-CM

## 2024-09-11 LAB — CALPROTECTIN, FECAL: Calprotectin, Fecal: 6 ug/g (ref 0–120)

## 2024-09-11 NOTE — Patient Instructions (Signed)
 _______________________________________________________  If your blood pressure at your visit was 140/90 or greater, please contact your primary care physician to follow up on this.  _______________________________________________________  If you are age 55 or older, your body mass index should be between 23-30. Your Body mass index is 26.17 kg/m. If this is out of the aforementioned range listed, please consider follow up with your Primary Care Provider.  If you are age 73 or younger, your body mass index should be between 19-25. Your Body mass index is 26.17 kg/m. If this is out of the aformentioned range listed, please consider follow up with your Primary Care Provider.   ________________________________________________________  The [facility] providers would like to encourage you to use MYCHART to communicate with providers for non-urgent requests or questions.  Due to long hold times on the telephone, sending your provider a message by [facility] may be a faster and more efficient way to get a response.  Please allow 48 business hours for a response.  Please remember that this is for non-urgent requests.  _______________________________________________________  [facility] is using a team-based approach to care.  Your team is made up of your doctor and two to three APPS. Our APPS (Nurse Practitioners and Physician Assistants) work with your physician to ensure care continuity for you. They are fully qualified to address your health concerns and develop a treatment plan. They communicate directly with your gastroenterologist to care for you. Seeing the Advanced Practice Practitioners on your physician's team can help you by facilitating care more promptly, often allowing for earlier appointments, access to diagnostic testing, procedures, and other specialty referrals.   It was a pleasure to see you today!  Vito Cirigliano, D.O.

## 2024-09-11 NOTE — Progress Notes (Unsigned)
 Chief Complaint:    Ulcerative Colitis  GI History: 53 year old male with a history of anxiety, depression, bipolar, peripheral neuropathy and hypertension, follows in the GI clinic for the following:   1) Chronic diarrhea.  Longstanding history of chronic, loose, nonbloody stools for many years.  Typically passes 2 loose stools daily.  Not frank diarrhea. Previous unremarkable colonoscopy in 11/2008 and 07/2019. Negative celiac serology in 2009.   2) Ulcerative Colitis.  Presented to the GI clinic on 03/23/2023 with newer onset episodic, painless BRBPR superimposed on longer standing history of chronic loose stools.  Not much change with Benefiber and Imodium on demand along with topical therapy for hemorrhoids noted on exam.  Ultimately diagnosed with steroid responsive Ulcerative Colitis with cecal patch and mild backwash ileitis. - 03/27/2023: Colonoscopy: 2 mm transverse colon hyperplastic polyp, 4 mm sigmoid polyp. Localized inflammation in sigmoid located 25-30 cm from anal verge and an area of dense diverticulosis (path: Active colitis with erosion without chronic inflammatory changes). Localized mild inflammation with aphthous ulcers in the distal rectum (path: Active colitis with erosion without chronic inflammatory changes or granuloma). Left-sided diverticulosis, small transverse colon lipoma. Remainder of the colonic mucosa was normal-appearing with benign biopsies. Normal TI. Small internal hemorrhoids. Started on Canasa  suppositories - 06/06/2023: Follow-up in the GI clinic.  Rectal bleeding stopped and overall symptom improvement with Canasa  suppositories.  Still with 2 looser BM/day, but not changed from baseline.  No nocturnal stools, tenesmus.  Started fiber supplement and low FODMAP diet. - 07/05/2023: Message from patient with increased stool frequency 2/day with episodic BRB.  Started Lialda  4.8 g daily with continued Canasa .  Normal ESR/CRP.  Fecal calprotectin 4960 -07/20/2023: Follow-up  in GI clinic.  No change with Lialda .  Stopped Canasa  due to concern for paradoxical worsening colitis.  Stopped Lialda .  Negative GI path panel. - 07/26/2023: Colonoscopy: 6 mm ascending colon SSP, mild periappendiceal colitis (path: Mild active colitis without chronic changes), Moderate inflammation with aphthous ulceration in rectum through mid sigmoid colon, most pronounced in the sigmoid and worse compared with previous (path: Moderate chronic active colitis, focal active proctitis).  Otherwise normal mucosa from proximal sigmoid through ascending colon (path: Benign).  Small internal hemorrhoids, sigmoid diverticulosis, transverse colon lipoma.  A few small scattered areas of mildly erythematous mucosa in the TI (path benign).  Started on prednisone  with slow taper, stopped Lialda  altogether, made preparations for induction of biologic therapy. 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  QuantiFERON gold negative. HAV Ab+, HBsAg-, HBsAb- (scheduled HBV vaccine series) - 08/25/2023: Follow-up in GI clinic.  Restarted prednisone , and patient elected to restart Lialda  instead of escalation to biologic therapy.  Calprotectin 99 - 12/08/2023: Follow-up in GI clinic.  Prescribed analpram for perianal itching and increased mesalamine  back to 4.8 g/day.  CRP, CBC, CMP normal.  Calprotectin 221     Endoscopic History: - 11/2008: Colonoscopy: Mildly erythematous colon from rectum to mid ascending (path benign and without inflammation).  Biopsies negative for MC, IBD - 07/2019: Colonoscopy: Normal.  Biopsies negative for MC, IBD.  Repeat in 10 years - 03/27/2023: Colonoscopy: 2 mm transverse colon hyperplastic polyp, 4 mm sigmoid polyp (resected, but disintegrated upon retrieved.  No pathology).  Localized inflammation in sigmoid located 25-30 cm from anal verge and an area of dense diverticulosis (path: Active colitis with erosion without chronic inflammatory changes).  Localized mild inflammation with  aphthous ulcers in the distal rectum (path: Active colitis with erosion without chronic inflammatory changes or granuloma).  Left-sided diverticulosis, small transverse colon lipoma.  Remainder of the colonic mucosa was normal-appearing with benign biopsies.  Normal TI.  Small internal hemorrhoids.  Started on Canasa  suppositories - 07/26/2023: Colonoscopy: 6 mm ascending colon SSP, mild periappendiceal colitis (path: Mild active colitis without chronic changes), Moderate inflammation with aphthous ulceration in rectum through mid sigmoid colon, most pronounced in the sigmoid and worse compared with previous (path: Moderate chronic active colitis, focal active proctitis).  Otherwise normal mucosa from proximal sigmoid through ascending colon (path: Benign).  Small internal hemorrhoids, sigmoid diverticulosis, transverse colon lipoma.  A few small scattered areas of mildly erythematous mucosa in the TI (path benign).     Family history notable for father with colon polyps.  No family history of IBD or CRC.  HPI:     Patient is a 53 y.o. male presenting to the Gastroenterology Clinic for follow-up.  Was last seen by me on 02/15/2024.  Was doing well at that time after reintroducing Rowasa  enemas and continuing his Lialda .  He opted to hold off on escalation of therapy to one of the biologic agents, and instead reduced Lialda  to 2.4 g daily for maintenance, continued Rowasa  enemas, and plan for serial fecal calprotectin monitoring.  Repeat calprotectin in 05/2024 significant elevated at 1260, treated with prednisone  40 mg daily with taper and again discussed initiation of biologic therapy.  Calprotectin completely normalized 1 month later (24) indicating robust response to steroids.  Serial calprotectin's since then have been normal at 26 and 6, indicating quiescent disease on current therapy of Lialda  2.4 g daily and Rowasa  enemas.    Review of systems:     No chest pain, no SOB, no fevers, no urinary sx    Past Medical History:  Diagnosis Date   Anxiety    Bipolar disorder (HCC)    Blood transfusion without reported diagnosis    at age 30    Chronic kidney disease    Acute Kidney injury per pt. 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       lisinopril  (PRINIVIL ,ZESTRIL ) 10 MG tablet Take 1 tablet (10 mg total) by mouth daily. 90 tablet 1   mesalamine  (LIALDA ) 1.2 g EC tablet TAKE 4 TABLETS BY MOUTH DAILY. 120 tablet 9   mesalamine  (ROWASA ) 4 g enema Place 60 mLs (4 g total) rectally at bedtime. 1800 mL 6   No current facility-administered medications for this visit.    Physical Exam:     There were no vitals taken for this visit.  GENERAL:  Pleasant *** in NAD PSYCH: : Cooperative, normal affect EENT:  conjunctiva pink, mucous membranes moist, neck supple without masses CARDIAC:  RRR, ***murmur heard, no peripheral edema PULM: Normal respiratory effort, lungs CTA bilaterally, no wheezing ABDOMEN:  Nondistended, soft, nontender. No obvious masses, no hepatomegaly,  normal bowel sounds SKIN:  turgor, no lesions seen Musculoskeletal:  Normal muscle tone, normal strength NEURO: Alert and oriented x 3, no focal neurologic deficits   IMPRESSION and PLAN:    1) Ulcerative Colitis 53 year old male with Ulcerative Colitis (rectosigmoiditis with cecal patch and backwash ileitis).  There was concern earlier  for possible paradoxical response to 5-ASA therapy, but he has since actually done quite well with combination of Lialda  and Rowasa .  Rowasa  seemingly provides more clinical benefit, and he has had uptrending fecal  calprotectin's and eventually breakthrough symptoms when removing the Rowasa  in the last couple of months.   We again discussed escalation of therapy today.  We discussed the pros and cons of transitioning to Entyvio vs one of the newer agents such as Velsipity, Tremfya, Omvoh.  He is not a candidate for immunomodulators due to TPMT level of 8.  Ultimately, he does not want to start any biologic therapy at this time and instead continue with 5-ASA therapy as follows:   - Continue Lialda , but reduce to 2.4 g daily. If breakthrough, can try 3.6 gm daily - Continue Rowasa  enemas at bedtime for the foreseeable future - Repeat fecal calprotectin in 3 months.  If downtrending/normalized, will continue with current therapy - If recurrent breakthrough symptoms or uptrending fecal calprotectin, will again need to discuss escalation of therapy - ESR/CRP have not been reliable markers for him, so we will continue just checking fecal calprotectin as surrogate marker of response to therapy - Acupuncture    2) IBS-D Pre-existing history of overlapping IBS-D.  Largely well-controlled currently.         RTC in 1 year or sooner prn  Sandor LULLA Flatter ,DO, FACG 09/11/2024, 10:38 AM

## 2024-11-21 ENCOUNTER — Other Ambulatory Visit: Payer: Self-pay | Admitting: Gastroenterology

## 2024-12-07 ENCOUNTER — Other Ambulatory Visit: Payer: Self-pay | Admitting: Gastroenterology
